# Patient Record
Sex: Female | Born: 1950 | Race: White | Hispanic: No | Marital: Married | Smoking: Never smoker
Health system: Southern US, Community
[De-identification: ages and names within clinical notes are randomized; demographics above are authoritative.]

## PROBLEM LIST (undated history)

## (undated) DIAGNOSIS — R112 Nausea with vomiting, unspecified: Secondary | ICD-10-CM

## (undated) DIAGNOSIS — E079 Disorder of thyroid, unspecified: Secondary | ICD-10-CM

## (undated) DIAGNOSIS — E0590 Thyrotoxicosis, unspecified without thyrotoxic crisis or storm: Secondary | ICD-10-CM

## (undated) DIAGNOSIS — Z9889 Other specified postprocedural states: Secondary | ICD-10-CM

---

## 1998-08-23 ENCOUNTER — Encounter (INDEPENDENT_AMBULATORY_CARE_PROVIDER_SITE_OTHER): Payer: Self-pay

## 1998-08-23 ENCOUNTER — Other Ambulatory Visit: Admission: RE | Admit: 1998-08-23 | Discharge: 1998-08-23 | Payer: Self-pay | Admitting: Obstetrics & Gynecology

## 1999-01-21 ENCOUNTER — Other Ambulatory Visit: Admission: RE | Admit: 1999-01-21 | Discharge: 1999-01-21 | Payer: Self-pay | Admitting: Obstetrics & Gynecology

## 1999-08-26 ENCOUNTER — Encounter: Payer: Self-pay | Admitting: Obstetrics & Gynecology

## 1999-08-26 ENCOUNTER — Encounter: Admission: RE | Admit: 1999-08-26 | Discharge: 1999-08-26 | Payer: Self-pay | Admitting: Obstetrics & Gynecology

## 2000-02-02 ENCOUNTER — Other Ambulatory Visit: Admission: RE | Admit: 2000-02-02 | Discharge: 2000-02-02 | Payer: Self-pay | Admitting: Obstetrics & Gynecology

## 2000-04-20 ENCOUNTER — Encounter: Admission: RE | Admit: 2000-04-20 | Discharge: 2000-04-20 | Payer: Self-pay | Admitting: Internal Medicine

## 2000-04-20 ENCOUNTER — Encounter: Payer: Self-pay | Admitting: Internal Medicine

## 2001-03-24 ENCOUNTER — Other Ambulatory Visit: Admission: RE | Admit: 2001-03-24 | Discharge: 2001-03-24 | Payer: Self-pay | Admitting: Obstetrics & Gynecology

## 2002-02-02 HISTORY — PX: ABDOMINAL HYSTERECTOMY: SHX81

## 2002-07-10 ENCOUNTER — Other Ambulatory Visit: Admission: RE | Admit: 2002-07-10 | Discharge: 2002-07-10 | Payer: Self-pay | Admitting: Obstetrics & Gynecology

## 2002-09-21 ENCOUNTER — Observation Stay (HOSPITAL_COMMUNITY): Admission: RE | Admit: 2002-09-21 | Discharge: 2002-09-22 | Payer: Self-pay | Admitting: Obstetrics & Gynecology

## 2002-09-21 ENCOUNTER — Encounter (INDEPENDENT_AMBULATORY_CARE_PROVIDER_SITE_OTHER): Payer: Self-pay | Admitting: Specialist

## 2004-07-25 ENCOUNTER — Ambulatory Visit (HOSPITAL_COMMUNITY): Admission: RE | Admit: 2004-07-25 | Discharge: 2004-07-25 | Payer: Self-pay | Admitting: *Deleted

## 2005-05-08 ENCOUNTER — Encounter: Admission: RE | Admit: 2005-05-08 | Discharge: 2005-05-08 | Payer: Self-pay | Admitting: Internal Medicine

## 2006-09-01 ENCOUNTER — Encounter: Admission: RE | Admit: 2006-09-01 | Discharge: 2006-09-01 | Payer: Self-pay | Admitting: Internal Medicine

## 2006-09-06 ENCOUNTER — Encounter: Admission: RE | Admit: 2006-09-06 | Discharge: 2006-09-06 | Payer: Self-pay | Admitting: Internal Medicine

## 2007-03-18 ENCOUNTER — Encounter: Admission: RE | Admit: 2007-03-18 | Discharge: 2007-03-18 | Payer: Self-pay | Admitting: Internal Medicine

## 2007-09-06 ENCOUNTER — Encounter: Admission: RE | Admit: 2007-09-06 | Discharge: 2007-09-06 | Payer: Self-pay | Admitting: Internal Medicine

## 2008-08-08 ENCOUNTER — Encounter: Admission: RE | Admit: 2008-08-08 | Discharge: 2008-08-08 | Payer: Self-pay | Admitting: Internal Medicine

## 2008-09-10 ENCOUNTER — Encounter: Admission: RE | Admit: 2008-09-10 | Discharge: 2008-09-10 | Payer: Self-pay | Admitting: Internal Medicine

## 2009-09-19 ENCOUNTER — Encounter: Admission: RE | Admit: 2009-09-19 | Discharge: 2009-09-19 | Payer: Self-pay | Admitting: Internal Medicine

## 2010-06-12 ENCOUNTER — Ambulatory Visit
Admission: RE | Admit: 2010-06-12 | Discharge: 2010-06-12 | Disposition: A | Discharge status: Home / Self Care | Payer: BC Managed Care – PPO | Source: Ambulatory Visit | Attending: Internal Medicine | Admitting: Internal Medicine

## 2010-06-12 ENCOUNTER — Other Ambulatory Visit: Payer: Self-pay | Admitting: Internal Medicine

## 2010-06-12 DIAGNOSIS — J189 Pneumonia, unspecified organism: Secondary | ICD-10-CM

## 2010-06-20 NOTE — H&P (Signed)
NAMEPENNEY, Andrea Fritz                         ACCOUNT NO.:  [id number]   MEDICAL RECORD NO.:  [id number]                   PATIENT TYPE:  AMB   LOCATION:  SDC                                  FACILITY:  [facility]   PHYSICIAN:  Freddy Finner, M.D.                DATE OF BIRTH:  [birth year]   DATE OF ADMISSION:  DATE OF DISCHARGE:                                HISTORY & PHYSICAL   NOTE:  Anticipated date of admission; September 21, 2002.   ADMITTING DIAGNOSES:  1. Uterine fibroids.  2. Probable uterine adenomyosis.  3. Endometrial polyp.  4. Dysfunctional uterine bleeding.  5. Request for definitive surgical intervention due to prolonged     dysfunctional uterine bleeding.   HISTORY OF PRESENT ILLNESS:  The patient is a 60 year old white married  female, gravida 4, para 2, with a several-year history of dysfunctional  uterine bleeding with attempts to manage this with oral contraceptives, more  conventional hormonal replacement therapy including Vivelle skin patches and  Prometrium.  She had her first episode of superficial phlebitis in January  2002.  She has had a subsequent recurrence on at least two or three  occasions.  She has been evaluated by Dr. Truett Perna, a hematologist, with no  apparent etiology for the problems, except for superficial varicosities.  It  has been encouraged for her not to continue with estrogen due to  predisposition to have recurrent phlebitis; and, because of the persistent  abnormal bleeding she has requested surgical intervention.  She is now  admitted for laparoscopically assisted vaginal hysterectomy and bilateral  salpingo-oophorectomy.   REVIEW OF SYSTEMS:  The patient's current review of systems is otherwise  negative, except for some vasomotor symptoms, which have improved on Prozac.   PAST MEDICAL HISTORY:  No other known significant medical illnesses.   MEDICATIONS:  The patient is not chronically on any medications.   ALLERGIES:  The patient  is allergic to DOXYCYCLINE and AMPICILLIN.   BLOOD TRANSFUSIONS:  The patient has never had a blood transfusion.   SOCIAL HISTORY:  The patient is not a cigarette smoker.   PAST SURGICAL HISTORY:  Previous surgical procedures include a laparoscopic  tubal sterilization in 1991.  She had a T&A as a young person.  She has had  two vaginal births.   FAMILY HISTORY:  Family history is noncontributory.   PHYSICAL EXAMINATION:  HEENT:  Grossly within normal limits.  VITAL SIGNS:  Blood pressure in the office is 128/90.  NECK:  Thyroid gland is not palpably enlarged.  BREAST EXAMINATION:  Considered to be normal.  No palpable masses, no skin  change or nipple discharge.  HEART:  Normal sinus rhythm without murmurs, rubs or gallops.  CHEST: Clear to auscultation.  ABDOMEN:  Soft and nontender without appreciable organomegaly or palpable  masses.  PELVIC FINDINGS:  External genitalia, vagina and cervix are normal.  Bimanual reveals the uterus  to be slightly enlarged and slightly tender to  palpation.  There are no palpable adnexal masses.  RECTAL:  The rectum is palpably normal and rectovaginal exam confirms the  above findings.  EXTREMITIES:  There are superficial varicosities.  No active evidence of  superficial phlebitis at this point in time.   ASSESSMENT:  1. Uterine leiomyomata.  2. Probable uterine adenomyosis.  3. Endometrial polyp.  4. Dysfunctional uterine bleeding.   PLAN:  Laparoscopically assisted vaginal hysterectomy and bilateral salpingo-  oophorectomy.                                                 Freddy Finner, M.D.    WRN/MEDQ  D:  09/20/2002  T:  09/20/2002  Job:  [id number]

## 2010-06-20 NOTE — Discharge Summary (Signed)
   Andrea Fritz, Andrea Fritz                         ACCOUNT NO.:  [id number]   MEDICAL RECORD NO.:  [id number]                   PATIENT TYPE:  OBV   LOCATION:  9323                                 FACILITY:  [facility]   PHYSICIAN:  Freddy Finner, M.D.                DATE OF BIRTH:  February 20, 1950   DATE OF ADMISSION:  09/21/2002  DATE OF DISCHARGE:  09/22/2002                                 DISCHARGE SUMMARY   DISCHARGE DIAGNOSES:  1. Uterine enlargement.  2. Extensive uterine adenomyosis.   OPERATION/PROCEDURE:  Laparoscopic assisted vaginal hysterectomy, bilateral  salpingo-oophorectomy.   POSTOPERATIVE COMPLICATIONS:  None.   DISPOSITION:  The patient is in satisfactory and improved condition at the  time of her discharge. She is to have progressively increasing physical  activity, but no vaginal entry. No heavy lifting. She is to resume her  regular Prozac dose 20 mg a day. She is given Darvocet to be taken as needed  for postoperative pain. She is to use ibuprofen also as needed. She is to  take a regular diet. She is to call for fever, heavy bleeding, or severe  pain.   HISTORY OF PRESENT ILLNESS:  For details of the present illness, past  history, family history, review of systems, and physical exam are according  to the admission note.   Briefly the admission findings were remarkable for enlargement of the uterus  and her history is remarkable for a prolonged history of dysfunctional  uterine bleeding.   LABORATORY DATA:  Admission hemoglobin 11.4, white count 9.1, postoperative  hemoglobin 10.1, white count 8.1. Admission urinalysis was normal. Admission  prothrombin time of 52, normal.   HOSPITAL COURSE:  The patient was admitted on the morning of surgery. She  was treated perioperatively with PIS and with Cefotetan. The above described  operative procedure was accomplished without significant intraoperative  difficulty and without postoperative complications. On the  afternoon of the  first postoperative day she was ambulating without difficulty and tolerating  a regular diet, and having adequate bowel and bladder function. Her  condition was considered to be satisfactory and she was discharged with the  disposition as noted above.                                               Freddy Finner, M.D.    WRN/MEDQ  D:  10/19/2002  T:  10/19/2002  Job:  [id number]

## 2010-06-20 NOTE — Op Note (Signed)
NAMEFATIMATA, Andrea Fritz                         ACCOUNT NO.:  [id number]   MEDICAL RECORD NO.:  [id number]                   PATIENT TYPE:  OBV   LOCATION:  9323                                 FACILITY:  [facility]   PHYSICIAN:  Freddy Finner, M.D.                DATE OF BIRTH:  May 09, 1950   DATE OF PROCEDURE:  09/21/2002  DATE OF DISCHARGE:                                 OPERATIVE REPORT   PREOPERATIVE DIAGNOSES:  1. Fibroid adenomyosis.  2. Dysfunctional uterine bleeding.   POSTOPERATIVE DIAGNOSES:  1. Fibroid adenomyosis.  2. Dysfunctional uterine bleeding.   PROCEDURE:  Laparoscopically vaginal hysterectomy,  bilateral salpingo-  oophorectomy.   SURGEON:  Freddy Finner, M.D.   ASSISTANT:  Stann Mainland. Vincente Poli, M.D..   ANESTHESIA:  General.   INTRAOPERATIVE COMPLICATIONS:   ESTIMATED BLOOD LOSS:  150 mL.   DESCRIPTION OF PROCEDURE:  The patient was admitted on the morning of  surgery and placed PASIs, given a bolus of Cefotan IV. She was brought to  the operating room and placed under adequate general anesthesia. She placed  in the dorsal lithotomy position using Allen stirrups. A Betadine prep was  carried out of the abdomen, perineum and vagina using scrub followed by  solution. The bladder was evacuated with a Robinson catheter. A Hulka  tenaculum was attached to the cervix. Sterile drapes were applied.   Two small  incisions were made, 1 at the  umbilicus, 1 just above the  symphysis. Through the upper  incision a 10-11 disposable bladed trocar was  introduced without difficulty while elevating the anterior abdominal wall  manually. Direct inspection revealed  adequate placement. Pneumoperitoneum  was allowed to accumulate. No injury was encouraged. A 5-mm trocar was  placed in the lower incision and through the blunt probe and later a  grasping forceps used.   Systematic examination of the pelvic and abdominal contents was carried out.  The appendix was normal. No  apparent abnormality was noted in the upper  abdomen. The pelvic findings were remarkable only for enlargement of the  uterus to approximately 10 to [redacted] weeks gestational size. The uterus weighed  309 gm after removal.   Using the spring-loaded grasping forceps the tube and ovary on each side was  elevated  sequentially using the gyrus bipolar dissecting device. The  infundibular pelvic ligaments were taken and the upper broad ligaments taken  to the level  just above the uterine arteries.   Attention was then turned vaginally. A posterior weighted retractor was  placed. The cervix was grasped with a Gerilyn Pilgrim tenaculum. Deavers were used  anteriorly and laterally for exposure. A posterior  colpotomy incision was  made while tending the mucosa posterior  to the cervix. The cervix was  released with a scalpel, incising the mucosa throughout the remaining  circumference of the cervix.   Using the LigaSure system, the uterosacral pedicles, bladder pillars  and  cardinal ligament pedicles were sealed and divided. This was done after  advancing the bladder off the cervix. The bladder was further advanced at  this point and the anterior peritoneum entered. The uterine artery pedicles  on each side were retracted with the LigaSure system, sealed and divided and  an additional pedicle just above the vessels was taken, sealed and divided.  The uterus was large and was cored to allow delivery of the uterus through  the vaginal introitus.   After removal of the uterus the uterosacrals were anchored to the vaginal  mucosa with a mattress suture of 0 Monocryl. The uterosacrals were plicated  posteriorly and then closed with an interrupted Vicryl suture. The cuff was  closed vertically with figure-of-8 suture of Monocryl. A Foley catheter was  placed.   Reinspection laparoscopically was carried out. Hemostasis was noted to be  complete throughout. The procedure at this point was terminated. Gas was   allowed to escape from the abdomen. The skin incisions were closed with  interrupted subcuticular sutures of 3-0 Dexon. Then 0.5% plain Marcaine was  injected in the incision sites for postoperative analgesia.   The patient tolerated the procedure well. She was awakened and taken to  recovery in good condition.                                               Freddy Finner, M.D.    WRN/MEDQ  D:  09/21/2002  T:  09/21/2002  Job:  [id number]

## 2010-09-03 ENCOUNTER — Other Ambulatory Visit: Payer: Self-pay | Admitting: Internal Medicine

## 2010-09-03 DIAGNOSIS — Z1231 Encounter for screening mammogram for malignant neoplasm of breast: Secondary | ICD-10-CM

## 2010-09-23 ENCOUNTER — Ambulatory Visit
Admission: RE | Admit: 2010-09-23 | Discharge: 2010-09-23 | Disposition: A | Discharge status: Home / Self Care | Payer: BC Managed Care – PPO | Source: Ambulatory Visit | Attending: Internal Medicine | Admitting: Internal Medicine

## 2010-09-23 DIAGNOSIS — Z1231 Encounter for screening mammogram for malignant neoplasm of breast: Secondary | ICD-10-CM

## 2011-08-31 ENCOUNTER — Other Ambulatory Visit: Payer: Self-pay | Admitting: Internal Medicine

## 2011-08-31 DIAGNOSIS — Z1231 Encounter for screening mammogram for malignant neoplasm of breast: Secondary | ICD-10-CM

## 2011-09-24 ENCOUNTER — Ambulatory Visit
Admission: RE | Admit: 2011-09-24 | Discharge: 2011-09-24 | Disposition: A | Discharge status: Home / Self Care | Payer: BC Managed Care – PPO | Source: Ambulatory Visit | Attending: Internal Medicine | Admitting: Internal Medicine

## 2011-09-24 DIAGNOSIS — Z1231 Encounter for screening mammogram for malignant neoplasm of breast: Secondary | ICD-10-CM

## 2012-09-06 ENCOUNTER — Other Ambulatory Visit: Payer: Self-pay

## 2012-09-06 DIAGNOSIS — Z1231 Encounter for screening mammogram for malignant neoplasm of breast: Secondary | ICD-10-CM

## 2012-10-04 ENCOUNTER — Ambulatory Visit
Admission: RE | Admit: 2012-10-04 | Discharge: 2012-10-04 | Disposition: A | Discharge status: Home / Self Care | Payer: BC Managed Care – PPO | Source: Ambulatory Visit

## 2012-10-04 DIAGNOSIS — Z1231 Encounter for screening mammogram for malignant neoplasm of breast: Secondary | ICD-10-CM

## 2013-09-05 ENCOUNTER — Other Ambulatory Visit: Payer: Self-pay

## 2013-09-05 DIAGNOSIS — Z1231 Encounter for screening mammogram for malignant neoplasm of breast: Secondary | ICD-10-CM

## 2013-10-06 ENCOUNTER — Ambulatory Visit
Admission: RE | Admit: 2013-10-06 | Discharge: 2013-10-06 | Disposition: A | Discharge status: Home / Self Care | Payer: BC Managed Care – PPO | Source: Ambulatory Visit

## 2013-10-06 DIAGNOSIS — Z1231 Encounter for screening mammogram for malignant neoplasm of breast: Secondary | ICD-10-CM

## 2014-09-03 ENCOUNTER — Other Ambulatory Visit: Payer: Self-pay

## 2014-09-03 DIAGNOSIS — Z1231 Encounter for screening mammogram for malignant neoplasm of breast: Secondary | ICD-10-CM

## 2014-10-09 ENCOUNTER — Ambulatory Visit
Admission: RE | Admit: 2014-10-09 | Discharge: 2014-10-09 | Disposition: A | Discharge status: Home / Self Care | Payer: BC Managed Care – PPO | Source: Ambulatory Visit

## 2014-10-09 DIAGNOSIS — Z1231 Encounter for screening mammogram for malignant neoplasm of breast: Secondary | ICD-10-CM

## 2014-11-27 ENCOUNTER — Encounter (HOSPITAL_COMMUNITY): Payer: Self-pay

## 2014-11-27 ENCOUNTER — Emergency Department (HOSPITAL_COMMUNITY)
Admission: EM | Admit: 2014-11-27 | Discharge: 2014-11-27 | Disposition: A | Discharge status: Home / Self Care | Payer: Worker's Compensation | Attending: Emergency Medicine | Admitting: Emergency Medicine

## 2014-11-27 ENCOUNTER — Emergency Department (HOSPITAL_COMMUNITY): Payer: Worker's Compensation

## 2014-11-27 ENCOUNTER — Other Ambulatory Visit (HOSPITAL_COMMUNITY): Payer: Self-pay | Admitting: Orthopaedic Surgery

## 2014-11-27 DIAGNOSIS — S52002A Unspecified fracture of upper end of left ulna, initial encounter for closed fracture: Secondary | ICD-10-CM

## 2014-11-27 DIAGNOSIS — S52182A Other fracture of upper end of left radius, initial encounter for closed fracture: Secondary | ICD-10-CM | POA: Insufficient documentation

## 2014-11-27 DIAGNOSIS — S62112A Displaced fracture of triquetrum [cuneiform] bone, left wrist, initial encounter for closed fracture: Secondary | ICD-10-CM

## 2014-11-27 DIAGNOSIS — Y998 Other external cause status: Secondary | ICD-10-CM | POA: Diagnosis not present

## 2014-11-27 DIAGNOSIS — S52102A Unspecified fracture of upper end of left radius, initial encounter for closed fracture: Secondary | ICD-10-CM

## 2014-11-27 DIAGNOSIS — S52092A Other fracture of upper end of left ulna, initial encounter for closed fracture: Secondary | ICD-10-CM | POA: Diagnosis not present

## 2014-11-27 DIAGNOSIS — Y9289 Other specified places as the place of occurrence of the external cause: Secondary | ICD-10-CM | POA: Insufficient documentation

## 2014-11-27 DIAGNOSIS — S6992XA Unspecified injury of left wrist, hand and finger(s), initial encounter: Secondary | ICD-10-CM | POA: Diagnosis present

## 2014-11-27 DIAGNOSIS — S52592A Other fractures of lower end of left radius, initial encounter for closed fracture: Secondary | ICD-10-CM | POA: Insufficient documentation

## 2014-11-27 DIAGNOSIS — Y9389 Activity, other specified: Secondary | ICD-10-CM | POA: Diagnosis not present

## 2014-11-27 DIAGNOSIS — Z8639 Personal history of other endocrine, nutritional and metabolic disease: Secondary | ICD-10-CM | POA: Diagnosis not present

## 2014-11-27 DIAGNOSIS — S52502A Unspecified fracture of the lower end of left radius, initial encounter for closed fracture: Secondary | ICD-10-CM

## 2014-11-27 DIAGNOSIS — W108XXA Fall (on) (from) other stairs and steps, initial encounter: Secondary | ICD-10-CM | POA: Diagnosis not present

## 2014-11-27 HISTORY — DX: Disorder of thyroid, unspecified: E07.9

## 2014-11-27 MED ORDER — HYDROCODONE-ACETAMINOPHEN 5-325 MG PO TABS: 1.0000 | ORAL_TABLET | Freq: Four times a day (QID) | ORAL | Status: AC | PRN

## 2014-11-27 MED ORDER — LIDOCAINE-EPINEPHRINE 1 %-1:100000 IJ SOLN
10.0000 mL | Freq: Once | INTRAMUSCULAR | Status: AC
  Administered 2014-11-27: 10 mL
  Filled 2014-11-27: qty 1

## 2014-11-27 MED ORDER — ONDANSETRON HCL 4 MG/2ML IJ SOLN
4.0000 mg | Freq: Once | INTRAMUSCULAR | Status: AC
  Administered 2014-11-27: 4 mg via INTRAVENOUS
  Filled 2014-11-27: qty 2

## 2014-11-27 MED ORDER — HYDROCODONE-ACETAMINOPHEN 5-325 MG PO TABS
1.0000 | ORAL_TABLET | Freq: Once | ORAL | Status: AC
  Administered 2014-11-27: 1 via ORAL
  Filled 2014-11-27: qty 1

## 2014-11-27 MED ORDER — HYDROMORPHONE HCL 1 MG/ML IJ SOLN
0.5000 mg | Freq: Once | INTRAMUSCULAR | Status: AC
  Administered 2014-11-27: 0.5 mg via INTRAVENOUS
  Filled 2014-11-27: qty 1

## 2014-11-27 MED ORDER — HYDROMORPHONE HCL 1 MG/ML IJ SOLN
1.0000 mg | Freq: Once | INTRAMUSCULAR | Status: AC
Start: 2014-11-27 — End: 2014-11-27
  Administered 2014-11-27: 1 mg via INTRAVENOUS
  Filled 2014-11-27: qty 1

## 2014-11-27 NOTE — ED Notes (Signed)
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 Pt missed a step and fell of the stage. Patient reports pain on the left wrist and elbow. Reports hitting head with no LOC. Pt is ambulatory. Reports good circulation and feeling to the left arm.

## 2014-11-27 NOTE — ED Notes (Signed)
Patient returned from X-ray 

## 2014-11-27 NOTE — Discharge Instructions (Signed)
Forearm Fracture A forearm fracture is a break in one or both of the bones of your arm that are between the elbow and the wrist. Your forearm is made up of two bones:  Radius. This is the bone on the inside of your arm near your thumb.  Ulna. This is the bone on the outside of your arm near your little finger. Middle forearm fractures usually break both the radius and the ulna. Most forearm fractures that involve both the ulna and radius will require surgery. CAUSES Common causes of this type of fracture include:  Falling on an outstretched arm.  Accidents, such as a car or bike accident.  A hard, direct hit to the middle part of your arm. RISK FACTORS You may be at higher risk for this type of fracture if:  You play contact sports.  You have a condition that causes your bones to be weak or thin (osteoporosis). SIGNS AND SYMPTOMS A forearm fracture causes pain immediately after the injury. Other signs and symptoms include:  An abnormal bend or bump in your arm (deformity).  Swelling.  Numbness or tingling.  Tenderness.  Inability to turn your hand from side to side (rotate).  Bruising. DIAGNOSIS Your health care provider may diagnose a forearm fracture based on:  Your symptoms.  Your medical history, including any recent injury.  A physical exam. Your health care provider will look for any deformity and feel for tenderness over the break. Your health care provider will also check whether the bones are out of place.  An X-ray exam to confirm the diagnosis and learn more about the type of fracture. TREATMENT The goals of treatment are to get the bone or bones in proper position for healing and to keep the bones from moving so they will heal over time. Your treatment will depend on many factors, especially the type of fracture that you have.  If the fractured bone or bones:  Are in the correct position (nondisplaced), you may only need to wear a cast or a  splint.  Have a slightly displaced fracture, you may need to have the bones moved back into place manually (closed reduction) before the splint or cast is put on.  You may have a temporary splint before you have a cast. The splint allows room for some swelling. After a few days, a cast can replace the splint.  You may have to wear the cast for 6-8 weeks or as directed by your health care provider.  The cast may be changed after about 3 weeks or as directed by your health care provider.  After your cast is removed, you may need physical therapy to regain full movement in your wrist or elbow.  You may need emergency surgery if you have:  A fractured bone or bones that are out of position (displaced).  A fracture with multiple fragments (comminuted fracture).  A fracture that breaks the skin (open fracture). This type of fracture may require surgical wires, plates, or screws to hold the bone or bones in place.  You may have X-rays every couple of weeks to check on your healing. HOME CARE INSTRUCTIONS If You Have a Cast:  Do not stick anything inside the cast to scratch your skin. Doing that increases your risk of infection.  Check the skin around the cast every day. Report any concerns to your health care provider. You may put lotion on dry skin around the edges of the cast. Do not apply lotion to the skin  underneath the cast. If You Have a Splint:  Wear it as directed by your health care provider. Remove it only as directed by your health care provider.  Loosen the splint if your fingers become numb and tingle, or if they turn cold and blue. Bathing  Cover the cast or splint with a watertight plastic bag to protect it from water while you bathe or shower. Do not let the cast or splint get wet. Managing Pain, Stiffness, and Swelling  If directed, apply ice to the injured area:  Put ice in a plastic bag.  Place a towel between your skin and the bag.  Leave the ice on for 20  minutes, 2-3 times a day.  Move your fingers often to avoid stiffness and to lessen swelling.  Raise the injured area above the level of your heart while you are sitting or lying down. Driving  Do not drive or operate heavy machinery while taking pain medicine.  Do not drive while wearing a cast or splint on a hand that you use for driving. Activity  Return to your normal activities as directed by your health care provider. Ask your health care provider what activities are safe for you.  Perform range-of-motion exercises only as directed by your health care provider. Safety  Do not use your injured limb to support your body weight until your health care provider says that you can. General Instructions  Do not put pressure on any part of the cast or splint until it is fully hardened. This may take several hours.  Keep the cast or splint clean and dry.  Do not use any tobacco products, including cigarettes, chewing tobacco, or electronic cigarettes. Tobacco can delay bone healing. If you need help quitting, ask your health care provider.  Take medicines only as directed by your health care provider.  Keep all follow-up visits as directed by your health care provider. This is important. SEEK MEDICAL CARE IF:  Your pain medicine is not helping.  Your cast or splint becomes wet or damaged or suddenly feels too tight.  Your cast becomes loose.  You have more severe pain or swelling than you did before the cast.  You have severe pain when you stretch your fingers.  You continue to have pain or stiffness in your elbow or your wrist after your cast is removed. SEEK IMMEDIATE MEDICAL CARE IF:  You cannot move your fingers.  You lose feeling in your fingers or your hand.  Your hand or your fingers turn cold and pale or blue.  You notice a bad smell coming from your cast.  You have drainage from underneath your cast.  You have new stains from blood or drainage that is coming  through your cast.   This information is not intended to replace advice given to you by your health care provider. Make sure you discuss any questions you have with your health care provider.   Document Released: 01/17/2000 Document Revised: 02/09/2014 Document Reviewed: 09/04/2013 Elsevier Interactive Patient Education [employer].

## 2014-11-27 NOTE — Progress Notes (Signed)
Orthopedic Tech Progress Note Patient Details:  Andrea Fritz 10-21-1950 [id number]  Ortho Devices Type of Ortho Device: Ace wrap, Post (long arm) splint, Sugartong splint Ortho Device/Splint Location: LUE Ortho Device/Splint Interventions: Ordered, Application   Braulio Bosch 11/27/2014, 3:44 PM

## 2014-11-27 NOTE — ED Notes (Signed)
Spoke with Ortho. Pt is on the list to be done. Pt made aware. Waiting for Ortho.

## 2014-11-27 NOTE — ED Notes (Signed)
Spoke with Hand Surgeon about patient's care. Patient comfortable.

## 2014-11-27 NOTE — ED Provider Notes (Addendum)
CSN: [id number]     Arrival date & time 11/27/14  1052 History   First MD Initiated Contact with Patient 11/27/14 1100     Chief Complaint  Patient presents with  . Arm Injury     (Consider location/radiation/quality/duration/timing/severity/associated sxs/prior Treatment) Patient is a 64 y.o. female presenting with arm injury. The history is provided by the patient.  Arm Injury Location:  Wrist and elbow Time since incident:  1 hour Injury: yes   Mechanism of injury: fall   Mechanism of injury comment:  Pt was walking up the risers with her class at a church and she stepped backwards but there was no step.  she fell down on the left outstretched arm Fall:    Fall occurred:  Down stairs   Impact surface:  Chief Technology Officer of impact:  Outstretched arms   Entrapped after fall: no   Elbow location:  L elbow Wrist location:  L wrist Pain details:    Quality:  Shooting, throbbing and sharp   Radiates to:  Does not radiate   Severity:  Severe   Onset quality:  Sudden   Timing:  Constant   Progression:  Unchanged Chronicity:  New Handedness:  Right-handed Dislocation: yes   Foreign body present:  No foreign bodies Prior injury to area:  No Relieved by:  Immobilization Worsened by:  Movement Ineffective treatments:  None tried Associated symptoms: decreased range of motion, stiffness and swelling   Associated symptoms: no muscle weakness, no numbness and no tingling   Associated symptoms comment:  No loss of consciousness or significant head trauma. She thinks she may of bumped her head on the floor but has no complaints of headache   Past Medical History  Diagnosis Date  . Thyroid disease     Hypothyroidism   Past Surgical History  Procedure Laterality Date  . Abdominal hysterectomy     No family history on file. Social History  Substance Use Topics  . Smoking status: Never Smoker   . Smokeless tobacco: Never Used  . Alcohol Use: No   OB History    No  data available     Review of Systems  Musculoskeletal: Positive for stiffness.  All other systems reviewed and are negative.     Allergies  Red clover and Tetracyclines & related  Home Medications   Prior to Admission medications   Not on File   BP 145/82 mmHg  Pulse 85  Resp 16  SpO2 99% Physical Exam  Constitutional: She is oriented to person, place, and time. She appears well-developed and well-nourished. No distress.  HENT:  Head: Normocephalic and atraumatic.  Mouth/Throat: Oropharynx is clear and moist.  Eyes: Conjunctivae and EOM are normal. Pupils are equal, round, and reactive to light.  Neck: Normal range of motion. Neck supple.  Cardiovascular: Normal rate, regular rhythm and intact distal pulses.   No murmur heard. Pulmonary/Chest: Effort normal and breath sounds normal. No respiratory distress. She has no wheezes. She has no rales.  Abdominal: Soft. She exhibits no distension. There is no tenderness. There is no rebound and no guarding.  Musculoskeletal: She exhibits no edema.       Left shoulder: Normal.       Left elbow: She exhibits decreased range of motion, swelling and deformity. Tenderness found.       Left wrist: She exhibits decreased range of motion, tenderness, bony tenderness, swelling and deformity.       Cervical back: Normal.  Normal sensation and  grip of the left hand. 2+ radial pulse. Unable to range the wrist or the elbow due to injury and deformity. Normal shoulder exam. No pain with palpation over the legs and full range of motion of the hips. Patient is able to ambulate with only minimal pain  Neurological: She is alert and oriented to person, place, and time.  Skin: Skin is warm and dry. No rash noted. No erythema.  Psychiatric: She has a normal mood and affect. Her behavior is normal.  Nursing note and vitals reviewed.   ED Course  Procedures (including critical care time) Labs Review Labs Reviewed - No data to display  Imaging  Review Dg Elbow Complete Left  11/27/2014  CLINICAL DATA:  Pain and deformity in the left elbow and wrist after a fall, initial encounter. EXAM: LEFT ELBOW - COMPLETE 3+ VIEW COMPARISON:  None. FINDINGS: A comminuted fracture of the proximal ulna is seen with approximately 10 mm of distraction of the fracture fragments. Nondisplaced radial head fracture is seen as well. Marked overlying soft tissue swelling. IMPRESSION: Proximal ulnar and radial head fractures. Electronically Signed   By: Lorin Picket M.D.   On: 11/27/2014 12:45   Dg Wrist Complete Left  11/27/2014  CLINICAL DATA:  Post closed reduction for a left wrist fracture. EXAM: LEFT WRIST - COMPLETE 3+ VIEW COMPARISON:  11/27/2014 at 12:22 p.m. FINDINGS: Following closed reduction, there has been improved alignment of the distal radial fracture. Radial length has been returned to near anatomic. The dorsal displacement of distal radial fracture component has been partly reduced with approximately 3.5 mm of displacement persisting. The dorsal angulation of the distal radial articular surface has been improved, with approximately 21 degrees of residual dorsal articular surface angulation. IMPRESSION: Partial reduction of the distal radial fracture as described. Electronically Signed   By: Lajean Manes M.D.   On: 11/27/2014 16:01   Dg Wrist Complete Left  11/27/2014  CLINICAL DATA:  Fall with pain in deformity of the left wrist. Initial encounter. EXAM: LEFT WRIST - COMPLETE 3+ VIEW COMPARISON:  None. FINDINGS: Comminuted fracture of the distal radius with articular extension along the dorsal and medial aspect of the radius. The fracture is posteriorly displaced and impacted with dorsal tilting. There is associated loss of radial inclination. Located radiocarpal joint. Avulsion fracture from the dorsal triquetrum. IMPRESSION: 1. Comminuted and displaced distal radius fracture. 2. Dorsal triquetrum fracture. Electronically Signed   By: Monte Fantasia M.D.   On: 11/27/2014 12:47   I have personally reviewed and evaluated these images and lab results as part of my medical decision-making.   EKG Interpretation None      MDM   Final diagnoses:  Fracture of triquetrum of left wrist, closed, initial encounter  Distal radius fracture, left, closed, initial encounter  Radius and ulna proximal end fracture, left, closed, initial encounter   Patient with a mechanical fall today off bleachers onto the floor on an outstretched arm. She did hit her head on the floor but it was minor and did not cause loss of consciousness. She denies a headache and has no history of taking blood thinners other than aspirin. She denies any neck or back pain. No chest tenderness or abdominal pain. She was able to ambulate without difficulty with low suspicion for any lower extremity injury. Concern for a left elbow fracture/dislocation as well as a both bone distal forearm fracture with deformity in the left wrist. Fractures are closed. Sensation and pulse are intact.  Imaging  pending and patient given IV pain control  2:50 PM Evidence of comminuted distal radius fracture, proximal ulna fracture and triquetrum fracture.  Discussed with Dr.Xu who wanted me to attempt bedside reduction. Hematoma block performed with good anesthesia and manual reduction. We'll splint and reimage.  4:07 PM Partial reduction.  Dr. Erlinda Hong office will f/u with the pt.   Blanchie Dessert, MD 11/27/14 1611  Blanchie Dessert, MD 11/27/14 [phone]

## 2014-11-27 NOTE — ED Notes (Signed)
Finger trap at bedside

## 2014-11-28 ENCOUNTER — Ambulatory Visit (HOSPITAL_COMMUNITY): Payer: Worker's Compensation

## 2014-11-28 ENCOUNTER — Ambulatory Visit (HOSPITAL_COMMUNITY): Payer: Worker's Compensation | Admitting: Certified Registered Nurse Anesthetist

## 2014-11-28 ENCOUNTER — Encounter (HOSPITAL_COMMUNITY)
Admission: RE | Disposition: A | Discharge status: Home / Self Care | Payer: Self-pay | Source: Ambulatory Visit | Attending: Orthopaedic Surgery

## 2014-11-28 ENCOUNTER — Encounter (HOSPITAL_COMMUNITY): Payer: Self-pay

## 2014-11-28 ENCOUNTER — Ambulatory Visit (HOSPITAL_COMMUNITY)
Admission: RE | Admit: 2014-11-28 | Discharge: 2014-11-28 | Disposition: A | Discharge status: Home / Self Care | Payer: Worker's Compensation | Source: Ambulatory Visit | Attending: Orthopaedic Surgery | Admitting: Orthopaedic Surgery

## 2014-11-28 DIAGNOSIS — S52022A Displaced fracture of olecranon process without intraarticular extension of left ulna, initial encounter for closed fracture: Secondary | ICD-10-CM | POA: Insufficient documentation

## 2014-11-28 DIAGNOSIS — S52122A Displaced fracture of head of left radius, initial encounter for closed fracture: Secondary | ICD-10-CM | POA: Insufficient documentation

## 2014-11-28 DIAGNOSIS — Z7982 Long term (current) use of aspirin: Secondary | ICD-10-CM | POA: Insufficient documentation

## 2014-11-28 DIAGNOSIS — Z79899 Other long term (current) drug therapy: Secondary | ICD-10-CM | POA: Insufficient documentation

## 2014-11-28 DIAGNOSIS — S62112A Displaced fracture of triquetrum [cuneiform] bone, left wrist, initial encounter for closed fracture: Secondary | ICD-10-CM | POA: Diagnosis not present

## 2014-11-28 DIAGNOSIS — W19XXXA Unspecified fall, initial encounter: Secondary | ICD-10-CM | POA: Insufficient documentation

## 2014-11-28 DIAGNOSIS — S52502A Unspecified fracture of the lower end of left radius, initial encounter for closed fracture: Secondary | ICD-10-CM | POA: Diagnosis not present

## 2014-11-28 DIAGNOSIS — E039 Hypothyroidism, unspecified: Secondary | ICD-10-CM | POA: Diagnosis not present

## 2014-11-28 DIAGNOSIS — Z419 Encounter for procedure for purposes other than remedying health state, unspecified: Secondary | ICD-10-CM

## 2014-11-28 HISTORY — DX: Thyrotoxicosis, unspecified without thyrotoxic crisis or storm: E05.90

## 2014-11-28 HISTORY — PX: ORIF ELBOW FRACTURE: SHX5031

## 2014-11-28 HISTORY — DX: Nausea with vomiting, unspecified: R11.2

## 2014-11-28 HISTORY — DX: Other specified postprocedural states: Z98.890

## 2014-11-28 HISTORY — PX: OPEN REDUCTION INTERNAL FIXATION (ORIF) DISTAL RADIAL FRACTURE: SHX5989

## 2014-11-28 LAB — CBC
HCT: 40.5 % (ref 36.0–46.0)
HEMOGLOBIN: 13.6 g/dL (ref 12.0–15.0)
MCH: 31.2 pg (ref 26.0–34.0)
MCHC: 33.6 g/dL (ref 30.0–36.0)
MCV: 92.9 fL (ref 78.0–100.0)
PLATELETS: 225 10*3/uL (ref 150–400)
RBC: 4.36 MIL/uL (ref 3.87–5.11)
RDW: 13.6 % (ref 11.5–15.5)
WBC: 10.9 10*3/uL — AB (ref 4.0–10.5)

## 2014-11-28 LAB — BASIC METABOLIC PANEL
ANION GAP: 15 (ref 5–15)
BUN: 10 mg/dL (ref 6–20)
CHLORIDE: 104 mmol/L (ref 101–111)
CO2: 20 mmol/L — ABNORMAL LOW (ref 22–32)
Calcium: 9.4 mg/dL (ref 8.9–10.3)
Creatinine, Ser: 0.65 mg/dL (ref 0.44–1.00)
GFR calc Af Amer: >60 mL/min (ref >60)
GLUCOSE: 124 mg/dL — AB (ref 65–99)
POTASSIUM: 3.9 mmol/L (ref 3.5–5.1)
SODIUM: 139 mmol/L (ref 135–145)

## 2014-11-28 SURGERY — OPEN REDUCTION INTERNAL FIXATION (ORIF) DISTAL RADIUS FRACTURE
Anesthesia: Regional | Site: Wrist | Laterality: Left

## 2014-11-28 MED ORDER — METHOCARBAMOL 750 MG PO TABS: 750.0000 mg | ORAL_TABLET | Freq: Two times a day (BID) | ORAL | Status: AC | PRN

## 2014-11-28 MED ORDER — PHENYLEPHRINE HCL 10 MG/ML IJ SOLN
INTRAMUSCULAR | Status: DC | PRN
  Administered 2014-11-28: 80 ug via INTRAVENOUS
  Administered 2014-11-28: 120 ug via INTRAVENOUS
  Administered 2014-11-28: 80 ug via INTRAVENOUS

## 2014-11-28 MED ORDER — ACETAMINOPHEN 325 MG PO TABS: 325.0000 mg | ORAL_TABLET | ORAL | Status: DC | PRN

## 2014-11-28 MED ORDER — EPHEDRINE SULFATE 50 MG/ML IJ SOLN
INTRAMUSCULAR | Status: AC
  Filled 2014-11-28: qty 1

## 2014-11-28 MED ORDER — OXYCODONE HCL 5 MG/5ML PO SOLN: 5.0000 mg | Freq: Once | ORAL | Status: DC | PRN

## 2014-11-28 MED ORDER — CEFAZOLIN SODIUM-DEXTROSE 2-3 GM-% IV SOLR
2.0000 g | INTRAVENOUS | Status: AC
  Administered 2014-11-28: 2 g via INTRAVENOUS

## 2014-11-28 MED ORDER — CEFAZOLIN SODIUM-DEXTROSE 2-3 GM-% IV SOLR
INTRAVENOUS | Status: AC
  Filled 2014-11-28: qty 50

## 2014-11-28 MED ORDER — FENTANYL CITRATE (PF) 250 MCG/5ML IJ SOLN
INTRAMUSCULAR | Status: AC
  Filled 2014-11-28: qty 5

## 2014-11-28 MED ORDER — EPHEDRINE SULFATE 50 MG/ML IJ SOLN
INTRAMUSCULAR | Status: DC | PRN
Start: 2014-11-28 — End: 2014-11-28
  Administered 2014-11-28 (×3): 10 mg via INTRAVENOUS

## 2014-11-28 MED ORDER — PROPOFOL 10 MG/ML IV BOLUS
INTRAVENOUS | Status: DC | PRN
  Administered 2014-11-28: 160 mg via INTRAVENOUS

## 2014-11-28 MED ORDER — ROCURONIUM BROMIDE 50 MG/5ML IV SOLN
INTRAVENOUS | Status: AC
  Filled 2014-11-28: qty 1

## 2014-11-28 MED ORDER — HYDROMORPHONE HCL 1 MG/ML IJ SOLN: 0.2500 mg | INTRAMUSCULAR | Status: DC | PRN

## 2014-11-28 MED ORDER — LACTATED RINGERS IV SOLN
INTRAVENOUS | Status: DC | PRN
  Administered 2014-11-28 (×2): via INTRAVENOUS

## 2014-11-28 MED ORDER — FENTANYL CITRATE (PF) 100 MCG/2ML IJ SOLN
100.0000 ug | Freq: Once | INTRAMUSCULAR | Status: AC
  Administered 2014-11-28: 100 ug via INTRAVENOUS

## 2014-11-28 MED ORDER — LACTATED RINGERS IV SOLN: INTRAVENOUS | Status: DC

## 2014-11-28 MED ORDER — SODIUM CHLORIDE 0.9 % IV SOLN
10.0000 mg | INTRAVENOUS | Status: DC | PRN
  Administered 2014-11-28: 50 ug/min via INTRAVENOUS

## 2014-11-28 MED ORDER — OXYCODONE HCL 5 MG PO TABS: 5.0000 mg | ORAL_TABLET | Freq: Once | ORAL | Status: DC | PRN

## 2014-11-28 MED ORDER — ACETAMINOPHEN 160 MG/5ML PO SOLN: 325.0000 mg | ORAL | Status: DC | PRN

## 2014-11-28 MED ORDER — LIDOCAINE HCL (CARDIAC) 20 MG/ML IV SOLN
INTRAVENOUS | Status: AC
  Filled 2014-11-28: qty 5

## 2014-11-28 MED ORDER — PROPOFOL 10 MG/ML IV BOLUS
INTRAVENOUS | Status: AC
  Filled 2014-11-28: qty 20

## 2014-11-28 MED ORDER — OXYCODONE-ACETAMINOPHEN 5-325 MG PO TABS: 1.0000 | ORAL_TABLET | ORAL | Status: AC | PRN

## 2014-11-28 MED ORDER — LACTATED RINGERS IV SOLN
INTRAVENOUS | Status: DC
  Administered 2014-11-28: 12:00:00 via INTRAVENOUS

## 2014-11-28 MED ORDER — LIDOCAINE HCL (CARDIAC) 20 MG/ML IV SOLN
INTRAVENOUS | Status: DC | PRN
  Administered 2014-11-28: 80 mg via INTRAVENOUS

## 2014-11-28 MED ORDER — BUPIVACAINE-EPINEPHRINE (PF) 0.5% -1:200000 IJ SOLN
INTRAMUSCULAR | Status: DC | PRN
  Administered 2014-11-28: 30 mL via PERINEURAL

## 2014-11-28 MED ORDER — ONDANSETRON HCL 4 MG/2ML IJ SOLN
INTRAMUSCULAR | Status: AC
  Filled 2014-11-28: qty 2

## 2014-11-28 MED ORDER — PHENYLEPHRINE 40 MCG/ML (10ML) SYRINGE FOR IV PUSH (FOR BLOOD PRESSURE SUPPORT)
PREFILLED_SYRINGE | INTRAVENOUS | Status: AC
  Filled 2014-11-28: qty 10

## 2014-11-28 MED ORDER — MIDAZOLAM HCL 2 MG/2ML IJ SOLN
2.0000 mg | Freq: Once | INTRAMUSCULAR | Status: AC
  Administered 2014-11-28: 2 mg via INTRAVENOUS

## 2014-11-28 MED ORDER — FENTANYL CITRATE (PF) 100 MCG/2ML IJ SOLN
INTRAMUSCULAR | Status: DC | PRN
  Administered 2014-11-28 (×2): 25 ug via INTRAVENOUS

## 2014-11-28 MED ORDER — MIDAZOLAM HCL 2 MG/2ML IJ SOLN
INTRAMUSCULAR | Status: AC
  Filled 2014-11-28: qty 4

## 2014-11-28 MED ORDER — SODIUM CHLORIDE 0.9 % IJ SOLN
INTRAMUSCULAR | Status: AC
  Filled 2014-11-28: qty 10

## 2014-11-28 MED ORDER — SODIUM CHLORIDE 0.9 % IR SOLN
Status: DC | PRN
  Administered 2014-11-28: 3000 mL

## 2014-11-28 MED ORDER — 0.9 % SODIUM CHLORIDE (POUR BTL) OPTIME
TOPICAL | Status: DC | PRN
  Administered 2014-11-28: 1000 mL

## 2014-11-28 MED ORDER — OXYCODONE HCL ER 10 MG PO T12A: 10.0000 mg | EXTENDED_RELEASE_TABLET | Freq: Two times a day (BID) | ORAL | Status: AC

## 2014-11-28 MED ORDER — ONDANSETRON HCL 4 MG/2ML IJ SOLN
INTRAMUSCULAR | Status: DC | PRN
  Administered 2014-11-28: 4 mg via INTRAVENOUS

## 2014-11-28 SURGICAL SUPPLY — 82 items
BANDAGE ELASTIC 3 VELCRO ST LF (GAUZE/BANDAGES/DRESSINGS) ×3 IMPLANT
BANDAGE ELASTIC 4 VELCRO ST LF (GAUZE/BANDAGES/DRESSINGS) ×3 IMPLANT
BIT DRILL 1.9X15 (BIT) ×2
BIT DRILL 1.9X15MM (BIT) ×2 IMPLANT
BIT DRILL 2.6 (BIT) ×3 IMPLANT
BLADE SURG 10 STRL SS (BLADE) ×3 IMPLANT
BLADE SURG 15 STRL LF DISP TIS (BLADE) ×2 IMPLANT
BLADE SURG 15 STRL SS (BLADE) ×1
BLADE SURG ROTATE 9660 (MISCELLANEOUS) IMPLANT
BNDG ESMARK 4X9 LF (GAUZE/BANDAGES/DRESSINGS) ×3 IMPLANT
BNDG GAUZE ELAST 4 BULKY (GAUZE/BANDAGES/DRESSINGS) IMPLANT
CORDS BIPOLAR (ELECTRODE) ×3 IMPLANT
COVER SURGICAL LIGHT HANDLE (MISCELLANEOUS) ×3 IMPLANT
CUFF TOURNIQUET SINGLE 18IN (TOURNIQUET CUFF) ×3 IMPLANT
CUFF TOURNIQUET SINGLE 24IN (TOURNIQUET CUFF) IMPLANT
DRAPE C-ARM 42X72 X-RAY (DRAPES) ×3 IMPLANT
DRAPE INCISE IOBAN 66X45 STRL (DRAPES) ×3 IMPLANT
DRAPE PROXIMA HALF (DRAPES) ×6 IMPLANT
DRAPE SURG 17X23 STRL (DRAPES) ×3 IMPLANT
DRAPE U-SHAPE 47X51 STRL (DRAPES) ×3 IMPLANT
DRILL BIT 1.9X15MM (BIT) ×1
DRSG PAD ABDOMINAL 8X10 ST (GAUZE/BANDAGES/DRESSINGS) ×3 IMPLANT
ELECT CAUTERY BLADE 6.4 (BLADE) ×3 IMPLANT
GAUZE SPONGE 4X4 12PLY STRL (GAUZE/BANDAGES/DRESSINGS) ×3 IMPLANT
GAUZE XEROFORM 1X8 LF (GAUZE/BANDAGES/DRESSINGS) ×3 IMPLANT
GAUZE XEROFORM 5X9 LF (GAUZE/BANDAGES/DRESSINGS) ×3 IMPLANT
GLOVE BIOGEL PI IND STRL 6.5 (GLOVE) ×4 IMPLANT
GLOVE BIOGEL PI INDICATOR 6.5 (GLOVE) ×2
GLOVE NEODERM STRL 7.5 LF PF (GLOVE) ×2 IMPLANT
GLOVE SURG NEODERM 7.5  LF PF (GLOVE) ×1
GLOVE SURG SS PI 6.5 STRL IVOR (GLOVE) ×3 IMPLANT
GLOVE SURG SS PI 8.0 STRL IVOR (GLOVE) ×6 IMPLANT
GOWN STRL REIN XL XLG (GOWN DISPOSABLE) ×3 IMPLANT
GOWN STRL REUS W/ TWL LRG LVL3 (GOWN DISPOSABLE) ×2 IMPLANT
GOWN STRL REUS W/TWL 2XL LVL3 (GOWN DISPOSABLE) ×3 IMPLANT
GOWN STRL REUS W/TWL LRG LVL3 (GOWN DISPOSABLE) ×1
K-WIRE 1.6X150 (WIRE) ×1
K-WIRE FX150X1.6XKRSH (WIRE) ×2
K-WIRE OLIVE STOP 17 (WIRE) ×3 IMPLANT
K-WIRE SMOOTH 1.4X150 (WIRE) ×3
KIT BASIN OR (CUSTOM PROCEDURE TRAY) ×3 IMPLANT
KIT ROOM TURNOVER OR (KITS) ×3 IMPLANT
KWIRE FX150X1.6XKRSH (WIRE) ×2 IMPLANT
KWIRE SMOOTH 1.4X150 (WIRE) ×2 IMPLANT
NEEDLE 22X1 1/2 (OR ONLY) (NEEDLE) ×3 IMPLANT
NS IRRIG 1000ML POUR BTL (IV SOLUTION) ×3 IMPLANT
PACK ORTHO EXTREMITY (CUSTOM PROCEDURE TRAY) ×3 IMPLANT
PAD ARMBOARD 7.5X6 YLW CONV (MISCELLANEOUS) ×6 IMPLANT
PAD CAST 4YDX4 CTTN HI CHSV (CAST SUPPLIES) ×2 IMPLANT
PADDING CAST ABS 4INX4YD NS (CAST SUPPLIES) ×1
PADDING CAST ABS COTTON 4X4 ST (CAST SUPPLIES) ×2 IMPLANT
PADDING CAST COTTON 4X4 STRL (CAST SUPPLIES) ×1
PLATE EXTRASHORT LEFT NARROW (Plate) ×3 IMPLANT
PLATE OLECRANON 4H LEFT (Plate) ×3 IMPLANT
SCREW BONE 2.7X14 (Screw) ×3 IMPLANT
SCREW BONE 2.7X16MM (Screw) ×6 IMPLANT
SCREW BONE 2.7X22 (Screw) ×3 IMPLANT
SCREW BONE 3.5X20MM (Screw) ×3 IMPLANT
SCREW LOCKING 18MMX3.5MM (Screw) ×3 IMPLANT
SCREW LOCKING 2.7X18 (Screw) ×3 IMPLANT
SCREW LOCKING 2.7X22 (Screw) ×3 IMPLANT
SCREW LOCKING 3.5X48MM (Screw) ×3 IMPLANT
SCREW LOCKING 3.5X60MM (Screw) ×3 IMPLANT
SCREW NON LOCKING 22MM (Screw) ×3 IMPLANT
SET CYSTO W/LG BORE CLAMP LF (SET/KITS/TRAYS/PACK) ×3 IMPLANT
SLING ARM IMMOBILIZER XL (CAST SUPPLIES) ×3 IMPLANT
SPONGE LAP 4X18 X RAY DECT (DISPOSABLE) IMPLANT
STRIP CLOSURE SKIN 1/2X4 (GAUZE/BANDAGES/DRESSINGS) ×3 IMPLANT
SUCTION FRAZIER TIP 8 FR DISP (SUCTIONS) ×1
SUCTION TUBE FRAZIER 8FR DISP (SUCTIONS) ×2 IMPLANT
SUT ETHILON 4 0 PS 2 18 (SUTURE) IMPLANT
SUT MNCRL AB 4-0 PS2 18 (SUTURE) ×6 IMPLANT
SUT PROLENE 3 0 PS 2 (SUTURE) IMPLANT
SUT VIC AB 2-0 CT1 27 (SUTURE) ×2
SUT VIC AB 2-0 CT1 TAPERPNT 27 (SUTURE) ×4 IMPLANT
SUT VIC AB 3-0 FS2 27 (SUTURE) IMPLANT
SYR CONTROL 10ML LL (SYRINGE) IMPLANT
TOWEL OR 17X24 6PK STRL BLUE (TOWEL DISPOSABLE) ×3 IMPLANT
TOWEL OR 17X26 10 PK STRL BLUE (TOWEL DISPOSABLE) ×3 IMPLANT
TUBE CONNECTING 12X1/4 (SUCTIONS) ×3 IMPLANT
UNDERPAD 30X30 INCONTINENT (UNDERPADS AND DIAPERS) ×3 IMPLANT
YANKAUER SUCT BULB TIP NO VENT (SUCTIONS) ×3 IMPLANT

## 2014-11-28 NOTE — Discharge Instructions (Signed)
Postoperative instructions:  Weightbearing: non weight bearing  Dressing instructions: Keep your dressing and/or splint clean and dry at all times.  It will be removed at your first post-operative appointment.  Your stitches and/or staples will be removed at this visit.  Incision instructions:  Do not soak your incision for 3 weeks after surgery.  If the incision gets wet, pat dry and do not scrub the incision.  Pain control:  You have been given a prescription to be taken as directed for post-operative pain control.  In addition, elevate the operative extremity above the heart at all times to prevent swelling and throbbing pain.  Take over-the-counter Colace, 100mg  by mouth twice a day while taking narcotic pain medications to help prevent constipation.  Follow up appointments: 1) 10-14 days for suture removal and wound check. 2) Dr. Erlinda Hong as scheduled.   -------------------------------------------------------------------------------------------------------------  After Surgery Pain Control:  After your surgery, post-surgical discomfort or pain is likely. This discomfort can last several days to a few weeks. At certain times of the day your discomfort may be more intense.  Did you receive a nerve block?  A nerve block can provide pain relief for one hour to two days after your surgery. As long as the nerve block is working, you will experience little or no sensation in the area the surgeon operated on.  As the nerve block wears off, you will begin to experience pain or discomfort. It is very important that you begin taking your prescribed pain medication before the nerve block fully wears off. Treating your pain at the first sign of the block wearing off will ensure your pain is better controlled and more tolerable when full-sensation returns. Do not wait until the pain is intolerable, as the medicine will be less effective. It is better to treat pain in advance than to try and catch up.    General Anesthesia:  If you did not receive a nerve block during your surgery, you will need to start taking your pain medication shortly after your surgery and should continue to do so as prescribed by your surgeon.  Pain Medication:  Most commonly we prescribe Vicodin and Percocet for post-operative pain. Both of these medications contain a combination of acetaminophen (Tylenol) and a narcotic to help control pain.   It takes between 30 and 45 minutes before pain medication starts to work. It is important to take your medication before your pain level gets too intense.   Nausea is a common side effect of many pain medications. You will want to eat something before taking your pain medicine to help prevent nausea.   If you are taking a prescription pain medication that contains acetaminophen, we recommend that you do not take additional over the counter acetaminophen (Tylenol).  Other pain relieving options:   Using a cold pack to ice the affected area a few times a day (15 to 20 minutes at a time) can help to relieve pain, reduce swelling and bruising.   Elevation of the affected area can also help to reduce pain and swelling.

## 2014-11-28 NOTE — H&P (Signed)
    PREOPERATIVE H&P  Chief Complaint: left distal radius fracture, left olecranon fracture  HPI: Andrea Fritz is a 64 y.o. female who presents for surgical treatment of left distal radius fracture, left olecranon fracture.  She denies any changes in medical history.  Past Medical History  Diagnosis Date  . Thyroid disease     Hypothyroidism   Past Surgical History  Procedure Laterality Date  . Abdominal hysterectomy     Social History   Social History  . Marital Status: Married    Spouse Name: N/A  . Number of Children: N/A  . Years of Education: N/A   Social History Main Topics  . Smoking status: Never Smoker   . Smokeless tobacco: Never Used  . Alcohol Use: No  . Drug Use: No  . Sexual Activity: Not on file   Other Topics Concern  . Not on file   Social History Narrative  . No narrative on file   No family history on file. Allergies  Allergen Reactions  . Red Clover [Trifolium Pratense] Hives  . Tetracyclines & Related Rash   Prior to Admission medications   Medication Sig Start Date End Date Taking? Authorizing Provider  amitriptyline (ELAVIL) 10 MG tablet Take 10 mg by mouth at bedtime.    Historical Provider, MD  aspirin 81 MG tablet Take 81 mg by mouth daily.    Historical Provider, MD  FLUoxetine (PROZAC) 40 MG capsule Take 40 mg by mouth daily.    Historical Provider, MD  HYDROcodone-acetaminophen (NORCO/VICODIN) 5-325 MG tablet Take 1-2 tablets by mouth every 6 (six) hours as needed. 11/27/14   Blanchie Dessert, MD  levothyroxine (SYNTHROID, LEVOTHROID) 50 MCG tablet Take 50 mcg by mouth daily before breakfast.    Historical Provider, MD  simvastatin (ZOCOR) 40 MG tablet Take 40 mg by mouth daily.    Historical Provider, MD     Positive ROS: All other systems have been reviewed and were otherwise negative with the exception of those mentioned in the HPI and as above.  Physical Exam: General: Alert, no acute distress Cardiovascular: No pedal  edema Respiratory: No cyanosis, no use of accessory musculature GI: abdomen soft Skin: No lesions in the area of chief complaint Neurologic: Sensation intact distally Psychiatric: Patient is competent for consent with normal mood and affect Lymphatic: no lymphedema  MUSCULOSKELETAL: exam stable  Assessment: left distal radius fracture, left olecranon fracture  Plan: Plan for Procedure(s): OPEN REDUCTION INTERNAL FIXATION (ORIF) LEFT DISTAL RADIUS FRACTURE AND LEFT OLECRANON FRACTURE OPEN REDUCTION INTERNAL FIXATION (ORIF) ELBOW/OLECRANON FRACTURE  The risks benefits and alternatives were discussed with the patient including but not limited to the risks of nonoperative treatment, versus surgical intervention including infection, bleeding, nerve injury,  blood clots, cardiopulmonary complications, morbidity, mortality, among others, and they were willing to proceed.   Marianna Payment, MD   11/28/2014 6:39 AM

## 2014-11-28 NOTE — Transfer of Care (Signed)
Immediate Anesthesia Transfer of Care Note  Patient: Andrea Fritz  Procedure(s) Performed: Procedure(s): OPEN REDUCTION INTERNAL FIXATION (ORIF) LEFT DISTAL RADIUS FRACTURE AND LEFT OLECRANON FRACTURE (Left) OPEN REDUCTION INTERNAL FIXATION (ORIF) ELBOW/OLECRANON FRACTURE (Left)  Patient Location: PACU  Anesthesia Type:General  Level of Consciousness: sedated  Airway & Oxygen Therapy: Patient Spontanous Breathing and Patient connected to nasal cannula oxygen  Post-op Assessment: Report given to RN and Post -op Vital signs reviewed and stable  Post vital signs: Reviewed and stable  Last Vitals:  Filed Vitals:   11/28/14 1455  BP: 130/67  Pulse: 82  Temp:   Resp: 16    Complications: No apparent anesthesia complications

## 2014-11-28 NOTE — Anesthesia Preprocedure Evaluation (Signed)
Anesthesia Evaluation  Patient identified by MRN, date of birth, ID band Patient awake    Reviewed: Allergy & Precautions, NPO status , Patient's Chart, lab work & pertinent test results  History of Anesthesia Complications (+) PONV and history of anesthetic complications  Airway Mallampati: II  TM Distance: >3 FB Neck ROM: Full    Dental  (+) Teeth Intact   Pulmonary neg pulmonary ROS,    breath sounds clear to auscultation       Cardiovascular negative cardio ROS   Rhythm:Regular     Neuro/Psych negative neurological ROS  negative psych ROS   GI/Hepatic negative GI ROS, Neg liver ROS,   Endo/Other  Hypothyroidism   Renal/GU negative Renal ROS     Musculoskeletal   Abdominal   Peds  Hematology negative hematology ROS (+)   Anesthesia Other Findings   Reproductive/Obstetrics                             Anesthesia Physical Anesthesia Plan  ASA: II  Anesthesia Plan: Regional and General   Post-op Pain Management:    Induction: Intravenous  Airway Management Planned: LMA  Additional Equipment: None  Intra-op Plan:   Post-operative Plan: Extubation in OR  Informed Consent: I have reviewed the patients History and Physical, chart, labs and discussed the procedure including the risks, benefits and alternatives for the proposed anesthesia with the patient or authorized representative who has indicated his/her understanding and acceptance.   Dental advisory given  Plan Discussed with: CRNA and Surgeon  Anesthesia Plan Comments:         Anesthesia Quick Evaluation

## 2014-11-28 NOTE — Anesthesia Procedure Notes (Addendum)
Anesthesia Regional Block:  Supraclavicular block  Pre-Anesthetic Checklist: ,, timeout performed, Correct Patient, Correct Site, Correct Laterality, Correct Procedure, Correct Position, site marked, Risks and benefits discussed,  Surgical consent,  Pre-op evaluation,  At surgeon's request and post-op pain management  Laterality: Upper and Left  Prep: chloraprep       Needles:  Injection technique: Single-shot  Needle Type: Echogenic Needle          Additional Needles:  Procedures: ultrasound guided (picture in chart) Supraclavicular block Narrative:  Injection made incrementally with aspirations every 5 mL.  Performed by: Personally   Additional Notes: H+P and labs reviewed, risks and benefits discussed with patient, procedure tolerated well without complications   Procedure Name: LMA Insertion Date/Time: 11/28/2014 3:18 PM Performed by: Neldon Newport Pre-anesthesia Checklist: Patient being monitored, Suction available, Emergency Drugs available, Patient identified and Timeout performed Patient Re-evaluated:Patient Re-evaluated prior to inductionOxygen Delivery Method: Circle system utilized Preoxygenation: Pre-oxygenation with 100% oxygen Intubation Type: IV induction Ventilation: Mask ventilation without difficulty LMA: LMA inserted LMA Size: 4.0 Number of attempts: 1 Placement Confirmation: positive ETCO2 and breath sounds checked- equal and bilateral Tube secured with: Tape Dental Injury: Teeth and Oropharynx as per pre-operative assessment

## 2014-11-28 NOTE — Anesthesia Postprocedure Evaluation (Signed)
Anesthesia Post Note  Patient: Andrea Fritz  Procedure(s) Performed: Procedure(s) (LRB): OPEN REDUCTION INTERNAL FIXATION (ORIF) LEFT DISTAL RADIUS FRACTURE AND LEFT OLECRANON FRACTURE (Left) OPEN REDUCTION INTERNAL FIXATION (ORIF) ELBOW/OLECRANON FRACTURE (Left)  Anesthesia type: general  Patient location: PACU  Post pain: Pain level controlled  Post assessment: Patient's Cardiovascular Status Stable  Last Vitals:  Filed Vitals:   11/28/14 1737  BP: 116/59  Pulse:   Temp: 36.9 C  Resp: 17    Post vital signs: Reviewed and stable  Level of consciousness: sedated  Complications: No apparent anesthesia complications

## 2014-11-28 NOTE — Op Note (Signed)
   Date of Surgery: 11/28/2014  INDICATIONS: Andrea Fritz is a 64 y.o.-year-old female with a left distal radius and olecranon fracture from a fall;  The patient did consent to the procedure after discussion of the risks and benefits.  PREOPERATIVE DIAGNOSIS: 1. Left distal radius fracture 2. Left olecranon fracture 3. Left nondisplaced radial head fracture 4. Left triquetral avulsion fracture  POSTOPERATIVE DIAGNOSIS: Same.  PROCEDURE:  1. Open reduction and internal fixation of left distal radius fracture more than 3 fragments 2. Open reduction and internal fixation of left olecranon fracture, separate incision 3. Closed treatment of left radial head fracture 4. Closed treatment of left triquetral fracture  SURGEON: N. Eduard Roux, M.D.  ASSIST: Joretta Bachelor, RNFA.  ANESTHESIA:  general, regional  IV FLUIDS AND URINE: See anesthesia.  ESTIMATED BLOOD LOSS: Minimal mL.  IMPLANTS: [employer]  DRAINS: None  COMPLICATIONS: None.  DESCRIPTION OF PROCEDURE: The patient was brought to the operating room and placed supine on the operating table.  The patient had been signed prior to the procedure and this was documented. The patient had the anesthesia placed by the anesthesiologist.  A time-out was performed to confirm that this was the correct patient, site, side and location. The patient did receive antibiotics prior to the incision and was re-dosed during the procedure as needed at indicated intervals.  A tourniquet placed.  The patient had the operative extremity prepped and draped in the standard surgical fashion.    The tourniquet was inflated to 250 mmHg. We began with the distal radius. A standard volar FCR approach was used. The pronator quadratus was elevated off of the distal radius. The fracture was exposed. Reduction was obtained with traction and manual maneuvers. This was held in place with a K wire. The reduction was confirmed under fluoroscopy. We then found the appropriate  volar distal radius plate and placed at the appropriate height and position. A series of locking and nonlocking screws were placed in the shaft and distally in the fracture fragment. Final x-rays were taken. Care was taken not to place screws in the wrist joint. We then turned our attention to the olecranon. A posterior incision was used over the elbow. Blunt dissection was carried down to the fascia. Full-thickness flaps were created. The proximal ulna was exposed. The fracture was also then exposed. Organized hematoma was removed. There was comminution of the olecranon fracture which required [place] to fix the fracture with a plate. We're able to obtain a reduction and this was held with a K wire. This was confirmed under fluoroscopy. We then the appropriate sized plate on the olecranon. We able to gain some compression across the fracture site by placing a nonlocking screw in the shaft in compression mode. We then placed a nonlocking and locking screws in the shaft and in the olecranon using fluoroscopy. Final x-rays were taken. The wounds were thoroughly irrigated with normal saline and closed in a layered fashion using 2-0 Vicryl and running 4-0 Monocryl. Sterile dressings were applied. The tourniquet was deflated. The arm was immobilized in a long-arm splint and volar wrist splint. Patient was x-rayed and transferred to the PACU in stable condition.  POSTOPERATIVE PLAN: Patient will be discharged home. She will be nonweightbearing to the left upper extremity. We will see her in the office in 2 weeks for a postop check.  Azucena Cecil, MD [facility] [phone] 5:35 PM

## 2014-11-28 NOTE — Progress Notes (Signed)
John, CRNA at bedside.

## 2014-11-29 ENCOUNTER — Encounter (HOSPITAL_COMMUNITY): Payer: Self-pay | Admitting: Orthopaedic Surgery

## 2014-12-06 ENCOUNTER — Encounter (HOSPITAL_COMMUNITY): Payer: Self-pay | Admitting: Orthopaedic Surgery

## 2015-09-19 ENCOUNTER — Other Ambulatory Visit: Payer: Self-pay | Admitting: Internal Medicine

## 2015-09-19 DIAGNOSIS — Z1231 Encounter for screening mammogram for malignant neoplasm of breast: Secondary | ICD-10-CM

## 2015-10-11 ENCOUNTER — Ambulatory Visit
Admission: RE | Admit: 2015-10-11 | Discharge: 2015-10-11 | Disposition: A | Discharge status: Home / Self Care | Payer: BC Managed Care – PPO | Source: Ambulatory Visit | Attending: Internal Medicine | Admitting: Internal Medicine

## 2015-10-11 DIAGNOSIS — Z1231 Encounter for screening mammogram for malignant neoplasm of breast: Secondary | ICD-10-CM

## 2016-07-14 DIAGNOSIS — R69 Illness, unspecified: Secondary | ICD-10-CM | POA: Diagnosis not present

## 2016-10-01 ENCOUNTER — Other Ambulatory Visit: Payer: Self-pay | Admitting: Internal Medicine

## 2016-10-01 DIAGNOSIS — Z1239 Encounter for other screening for malignant neoplasm of breast: Secondary | ICD-10-CM

## 2016-10-08 DIAGNOSIS — Z6841 Body Mass Index (BMI) 40.0 and over, adult: Secondary | ICD-10-CM | POA: Diagnosis not present

## 2016-10-08 DIAGNOSIS — E049 Nontoxic goiter, unspecified: Secondary | ICD-10-CM | POA: Diagnosis not present

## 2016-10-08 DIAGNOSIS — Z23 Encounter for immunization: Secondary | ICD-10-CM | POA: Diagnosis not present

## 2016-10-08 DIAGNOSIS — E7800 Pure hypercholesterolemia, unspecified: Secondary | ICD-10-CM | POA: Diagnosis not present

## 2016-10-08 DIAGNOSIS — I10 Essential (primary) hypertension: Secondary | ICD-10-CM | POA: Diagnosis not present

## 2016-10-08 DIAGNOSIS — R799 Abnormal finding of blood chemistry, unspecified: Secondary | ICD-10-CM | POA: Diagnosis not present

## 2016-10-08 DIAGNOSIS — Z0000 Encounter for general adult medical examination without abnormal findings: Secondary | ICD-10-CM | POA: Diagnosis not present

## 2016-10-08 DIAGNOSIS — E039 Hypothyroidism, unspecified: Secondary | ICD-10-CM | POA: Diagnosis not present

## 2016-10-13 ENCOUNTER — Ambulatory Visit
Admission: RE | Admit: 2016-10-13 | Discharge: 2016-10-13 | Disposition: A | Discharge status: Home / Self Care | Payer: Medicare HMO | Source: Ambulatory Visit | Attending: Internal Medicine | Admitting: Internal Medicine

## 2016-10-13 DIAGNOSIS — Z1231 Encounter for screening mammogram for malignant neoplasm of breast: Secondary | ICD-10-CM | POA: Diagnosis not present

## 2016-10-13 DIAGNOSIS — Z1239 Encounter for other screening for malignant neoplasm of breast: Secondary | ICD-10-CM

## 2016-10-14 ENCOUNTER — Other Ambulatory Visit: Payer: Self-pay | Admitting: Internal Medicine

## 2016-10-14 DIAGNOSIS — R928 Other abnormal and inconclusive findings on diagnostic imaging of breast: Secondary | ICD-10-CM

## 2016-10-20 ENCOUNTER — Ambulatory Visit
Admission: RE | Admit: 2016-10-20 | Discharge: 2016-10-20 | Disposition: A | Discharge status: Home / Self Care | Payer: Medicare HMO | Source: Ambulatory Visit | Attending: Internal Medicine | Admitting: Internal Medicine

## 2016-10-20 DIAGNOSIS — R928 Other abnormal and inconclusive findings on diagnostic imaging of breast: Secondary | ICD-10-CM

## 2016-10-20 DIAGNOSIS — N6321 Unspecified lump in the left breast, upper outer quadrant: Secondary | ICD-10-CM | POA: Diagnosis not present

## 2016-12-01 DIAGNOSIS — R69 Illness, unspecified: Secondary | ICD-10-CM | POA: Diagnosis not present

## 2017-04-21 DIAGNOSIS — R69 Illness, unspecified: Secondary | ICD-10-CM | POA: Diagnosis not present

## 2017-10-11 DIAGNOSIS — R8290 Unspecified abnormal findings in urine: Secondary | ICD-10-CM | POA: Diagnosis not present

## 2017-10-11 DIAGNOSIS — Z6841 Body Mass Index (BMI) 40.0 and over, adult: Secondary | ICD-10-CM | POA: Diagnosis not present

## 2017-10-11 DIAGNOSIS — E785 Hyperlipidemia, unspecified: Secondary | ICD-10-CM | POA: Diagnosis not present

## 2017-10-11 DIAGNOSIS — E039 Hypothyroidism, unspecified: Secondary | ICD-10-CM | POA: Diagnosis not present

## 2017-10-11 DIAGNOSIS — E049 Nontoxic goiter, unspecified: Secondary | ICD-10-CM | POA: Diagnosis not present

## 2017-10-11 DIAGNOSIS — D239 Other benign neoplasm of skin, unspecified: Secondary | ICD-10-CM | POA: Diagnosis not present

## 2017-10-12 ENCOUNTER — Other Ambulatory Visit: Payer: Self-pay | Admitting: Internal Medicine

## 2017-10-12 DIAGNOSIS — Z1231 Encounter for screening mammogram for malignant neoplasm of breast: Secondary | ICD-10-CM

## 2017-10-18 ENCOUNTER — Ambulatory Visit
Admission: RE | Admit: 2017-10-18 | Discharge: 2017-10-18 | Disposition: A | Discharge status: Home / Self Care | Payer: Medicare HMO | Source: Ambulatory Visit | Attending: Internal Medicine | Admitting: Internal Medicine

## 2017-10-18 DIAGNOSIS — Z1231 Encounter for screening mammogram for malignant neoplasm of breast: Secondary | ICD-10-CM | POA: Diagnosis not present

## 2017-12-06 DIAGNOSIS — R69 Illness, unspecified: Secondary | ICD-10-CM | POA: Diagnosis not present

## 2018-05-17 DIAGNOSIS — R69 Illness, unspecified: Secondary | ICD-10-CM | POA: Diagnosis not present

## 2018-08-08 DIAGNOSIS — R69 Illness, unspecified: Secondary | ICD-10-CM | POA: Diagnosis not present

## 2018-08-23 DIAGNOSIS — R69 Illness, unspecified: Secondary | ICD-10-CM | POA: Diagnosis not present

## 2018-08-24 DIAGNOSIS — R69 Illness, unspecified: Secondary | ICD-10-CM | POA: Diagnosis not present

## 2018-09-19 ENCOUNTER — Other Ambulatory Visit: Payer: Self-pay | Admitting: Internal Medicine

## 2018-09-19 DIAGNOSIS — Z1231 Encounter for screening mammogram for malignant neoplasm of breast: Secondary | ICD-10-CM

## 2018-10-14 DIAGNOSIS — I8001 Phlebitis and thrombophlebitis of superficial vessels of right lower extremity: Secondary | ICD-10-CM | POA: Diagnosis not present

## 2018-10-14 DIAGNOSIS — D229 Melanocytic nevi, unspecified: Secondary | ICD-10-CM | POA: Diagnosis not present

## 2018-10-14 DIAGNOSIS — E7800 Pure hypercholesterolemia, unspecified: Secondary | ICD-10-CM | POA: Diagnosis not present

## 2018-10-14 DIAGNOSIS — D559 Anemia due to enzyme disorder, unspecified: Secondary | ICD-10-CM | POA: Diagnosis not present

## 2018-10-14 DIAGNOSIS — Z23 Encounter for immunization: Secondary | ICD-10-CM | POA: Diagnosis not present

## 2018-10-14 DIAGNOSIS — E039 Hypothyroidism, unspecified: Secondary | ICD-10-CM | POA: Diagnosis not present

## 2018-10-14 DIAGNOSIS — I10 Essential (primary) hypertension: Secondary | ICD-10-CM | POA: Diagnosis not present

## 2018-10-31 ENCOUNTER — Other Ambulatory Visit: Payer: Self-pay

## 2018-10-31 ENCOUNTER — Ambulatory Visit
Admission: RE | Admit: 2018-10-31 | Discharge: 2018-10-31 | Disposition: A | Discharge status: Home / Self Care | Payer: Medicare HMO | Source: Ambulatory Visit | Attending: Internal Medicine | Admitting: Internal Medicine

## 2018-10-31 DIAGNOSIS — Z1231 Encounter for screening mammogram for malignant neoplasm of breast: Secondary | ICD-10-CM

## 2019-03-22 DIAGNOSIS — E042 Nontoxic multinodular goiter: Secondary | ICD-10-CM | POA: Diagnosis not present

## 2019-03-22 DIAGNOSIS — E038 Other specified hypothyroidism: Secondary | ICD-10-CM | POA: Diagnosis not present

## 2019-03-22 DIAGNOSIS — Z1331 Encounter for screening for depression: Secondary | ICD-10-CM | POA: Diagnosis not present

## 2019-03-22 DIAGNOSIS — N951 Menopausal and female climacteric states: Secondary | ICD-10-CM | POA: Diagnosis not present

## 2019-03-22 DIAGNOSIS — E7849 Other hyperlipidemia: Secondary | ICD-10-CM | POA: Diagnosis not present

## 2019-03-22 DIAGNOSIS — D239 Other benign neoplasm of skin, unspecified: Secondary | ICD-10-CM | POA: Diagnosis not present

## 2019-03-22 DIAGNOSIS — Z808 Family history of malignant neoplasm of other organs or systems: Secondary | ICD-10-CM | POA: Diagnosis not present

## 2019-03-26 ENCOUNTER — Ambulatory Visit: Payer: Medicare HMO | Attending: Internal Medicine

## 2019-03-26 DIAGNOSIS — Z23 Encounter for immunization: Secondary | ICD-10-CM | POA: Insufficient documentation

## 2019-03-26 NOTE — Progress Notes (Signed)
   Covid-19 Vaccination Clinic  Name:  Andrea Fritz    MRN: [id number] DOB: Jun 14, 1950  03/26/2019  Andrea Fritz was observed post Covid-19 immunization for 15 minutes without incidence. She was provided with Vaccine Information Sheet and instruction to access the V-Safe system.   Andrea Fritz was instructed to call 911 with any severe reactions post vaccine: Marland Kitchen Difficulty breathing  . Swelling of your face and throat  . A fast heartbeat  . A bad rash all over your body  . Dizziness and weakness    Immunizations Administered    Name Date Dose VIS Date Route   Pfizer COVID-19 Vaccine 03/26/2019  2:10 PM 0.3 mL 01/13/2019 Intramuscular   Manufacturer: [place]   Lot: [id number]   [place]: [id number]

## 2019-04-11 DIAGNOSIS — Z780 Asymptomatic menopausal state: Secondary | ICD-10-CM | POA: Diagnosis not present

## 2019-04-19 ENCOUNTER — Ambulatory Visit: Payer: Medicare HMO | Attending: Internal Medicine

## 2019-04-19 DIAGNOSIS — Z23 Encounter for immunization: Secondary | ICD-10-CM

## 2019-04-19 NOTE — Progress Notes (Signed)
   Covid-19 Vaccination Clinic  Name:  Martinique Mcmeen    MRN: [id number] DOB: 13-Jun-1950  04/19/2019  Ms. Demby was observed post Covid-19 immunization for 15 minutes without incident. She was provided with Vaccine Information Sheet and instruction to access the V-Safe system.   Ms. Lesperance was instructed to call 911 with any severe reactions post vaccine: Marland Kitchen Difficulty breathing  . Swelling of face and throat  . A fast heartbeat  . A bad rash all over body  . Dizziness and weakness   Immunizations Administered    Name Date Dose VIS Date Route   Pfizer COVID-19 Vaccine 04/19/2019  1:20 PM 0.3 mL 01/13/2019 Intramuscular   Manufacturer: [place]   Lot: [id number]   [place]: [id number]

## 2019-05-09 DIAGNOSIS — L821 Other seborrheic keratosis: Secondary | ICD-10-CM | POA: Diagnosis not present

## 2019-05-09 DIAGNOSIS — D2262 Melanocytic nevi of left upper limb, including shoulder: Secondary | ICD-10-CM | POA: Diagnosis not present

## 2019-05-09 DIAGNOSIS — D225 Melanocytic nevi of trunk: Secondary | ICD-10-CM | POA: Diagnosis not present

## 2019-05-09 DIAGNOSIS — D2261 Melanocytic nevi of right upper limb, including shoulder: Secondary | ICD-10-CM | POA: Diagnosis not present

## 2019-08-10 DIAGNOSIS — D225 Melanocytic nevi of trunk: Secondary | ICD-10-CM | POA: Diagnosis not present

## 2019-08-10 DIAGNOSIS — L918 Other hypertrophic disorders of the skin: Secondary | ICD-10-CM | POA: Diagnosis not present

## 2019-08-10 DIAGNOSIS — L821 Other seborrheic keratosis: Secondary | ICD-10-CM | POA: Diagnosis not present

## 2019-08-10 DIAGNOSIS — D485 Neoplasm of uncertain behavior of skin: Secondary | ICD-10-CM | POA: Diagnosis not present

## 2019-08-10 DIAGNOSIS — L820 Inflamed seborrheic keratosis: Secondary | ICD-10-CM | POA: Diagnosis not present

## 2019-08-10 DIAGNOSIS — D1809 Hemangioma of other sites: Secondary | ICD-10-CM | POA: Diagnosis not present

## 2019-08-10 DIAGNOSIS — L814 Other melanin hyperpigmentation: Secondary | ICD-10-CM | POA: Diagnosis not present

## 2019-09-19 ENCOUNTER — Other Ambulatory Visit: Payer: Self-pay | Admitting: Internal Medicine

## 2019-09-19 DIAGNOSIS — Z1231 Encounter for screening mammogram for malignant neoplasm of breast: Secondary | ICD-10-CM

## 2019-10-18 DIAGNOSIS — E039 Hypothyroidism, unspecified: Secondary | ICD-10-CM | POA: Diagnosis not present

## 2019-10-18 DIAGNOSIS — E785 Hyperlipidemia, unspecified: Secondary | ICD-10-CM | POA: Diagnosis not present

## 2019-10-24 DIAGNOSIS — G4700 Insomnia, unspecified: Secondary | ICD-10-CM | POA: Diagnosis not present

## 2019-10-24 DIAGNOSIS — Z0000 Encounter for general adult medical examination without abnormal findings: Secondary | ICD-10-CM | POA: Diagnosis not present

## 2019-10-24 DIAGNOSIS — S62102D Fracture of unspecified carpal bone, left wrist, subsequent encounter for fracture with routine healing: Secondary | ICD-10-CM | POA: Diagnosis not present

## 2019-10-24 DIAGNOSIS — N951 Menopausal and female climacteric states: Secondary | ICD-10-CM | POA: Diagnosis not present

## 2019-10-24 DIAGNOSIS — Z23 Encounter for immunization: Secondary | ICD-10-CM | POA: Diagnosis not present

## 2019-10-24 DIAGNOSIS — R3121 Asymptomatic microscopic hematuria: Secondary | ICD-10-CM | POA: Diagnosis not present

## 2019-10-24 DIAGNOSIS — E785 Hyperlipidemia, unspecified: Secondary | ICD-10-CM | POA: Diagnosis not present

## 2019-10-24 DIAGNOSIS — E669 Obesity, unspecified: Secondary | ICD-10-CM | POA: Diagnosis not present

## 2019-10-24 DIAGNOSIS — R82998 Other abnormal findings in urine: Secondary | ICD-10-CM | POA: Diagnosis not present

## 2019-10-24 DIAGNOSIS — Z808 Family history of malignant neoplasm of other organs or systems: Secondary | ICD-10-CM | POA: Diagnosis not present

## 2019-10-24 DIAGNOSIS — J302 Other seasonal allergic rhinitis: Secondary | ICD-10-CM | POA: Diagnosis not present

## 2019-10-24 DIAGNOSIS — D239 Other benign neoplasm of skin, unspecified: Secondary | ICD-10-CM | POA: Diagnosis not present

## 2019-10-31 DIAGNOSIS — Z1212 Encounter for screening for malignant neoplasm of rectum: Secondary | ICD-10-CM | POA: Diagnosis not present

## 2019-11-01 ENCOUNTER — Ambulatory Visit
Admission: RE | Admit: 2019-11-01 | Discharge: 2019-11-01 | Disposition: A | Discharge status: Home / Self Care | Payer: Medicare HMO | Source: Ambulatory Visit | Attending: Internal Medicine | Admitting: Internal Medicine

## 2019-11-01 ENCOUNTER — Other Ambulatory Visit: Payer: Self-pay

## 2019-11-01 DIAGNOSIS — Z1231 Encounter for screening mammogram for malignant neoplasm of breast: Secondary | ICD-10-CM

## 2019-12-06 DIAGNOSIS — Z23 Encounter for immunization: Secondary | ICD-10-CM | POA: Diagnosis not present

## 2020-02-05 DIAGNOSIS — Z23 Encounter for immunization: Secondary | ICD-10-CM | POA: Diagnosis not present

## 2020-02-12 DIAGNOSIS — D225 Melanocytic nevi of trunk: Secondary | ICD-10-CM | POA: Diagnosis not present

## 2020-02-12 DIAGNOSIS — Z85820 Personal history of malignant melanoma of skin: Secondary | ICD-10-CM | POA: Diagnosis not present

## 2020-02-12 DIAGNOSIS — L905 Scar conditions and fibrosis of skin: Secondary | ICD-10-CM | POA: Diagnosis not present

## 2020-02-12 DIAGNOSIS — L821 Other seborrheic keratosis: Secondary | ICD-10-CM | POA: Diagnosis not present

## 2020-02-12 DIAGNOSIS — D1801 Hemangioma of skin and subcutaneous tissue: Secondary | ICD-10-CM | POA: Diagnosis not present

## 2020-02-12 DIAGNOSIS — C434 Malignant melanoma of scalp and neck: Secondary | ICD-10-CM | POA: Diagnosis not present

## 2020-02-12 DIAGNOSIS — D2262 Melanocytic nevi of left upper limb, including shoulder: Secondary | ICD-10-CM | POA: Diagnosis not present

## 2020-02-12 DIAGNOSIS — D2261 Melanocytic nevi of right upper limb, including shoulder: Secondary | ICD-10-CM | POA: Diagnosis not present

## 2020-02-26 DIAGNOSIS — D034 Melanoma in situ of scalp and neck: Secondary | ICD-10-CM | POA: Diagnosis not present

## 2020-02-26 DIAGNOSIS — C434 Malignant melanoma of scalp and neck: Secondary | ICD-10-CM | POA: Diagnosis not present

## 2020-02-29 DIAGNOSIS — C434 Malignant melanoma of scalp and neck: Secondary | ICD-10-CM | POA: Diagnosis not present

## 2020-02-29 DIAGNOSIS — D034 Melanoma in situ of scalp and neck: Secondary | ICD-10-CM | POA: Diagnosis not present

## 2020-05-28 IMAGING — MG MM DIGITAL SCREENING BILAT W/ TOMO W/ CAD
8 series · 8 of 24 positions shown · non-contrast
Comparison: Previous exam(s).

CLINICAL DATA: Screening.

EXAM:
DIGITAL SCREENING BILATERAL MAMMOGRAM WITH TOMO AND CAD

[R CC synth-2D]
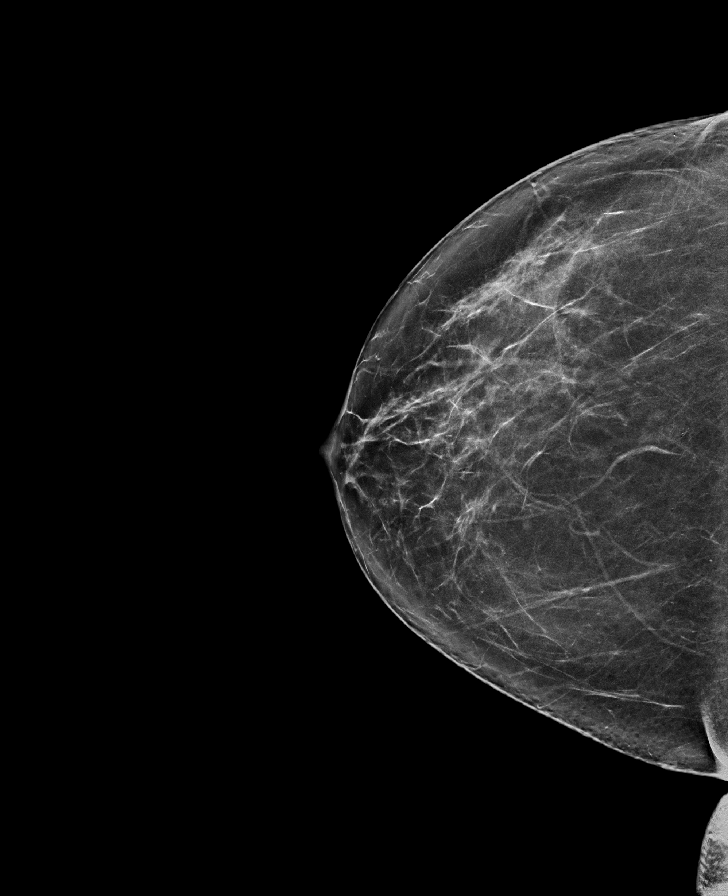

[L CC synth-2D]
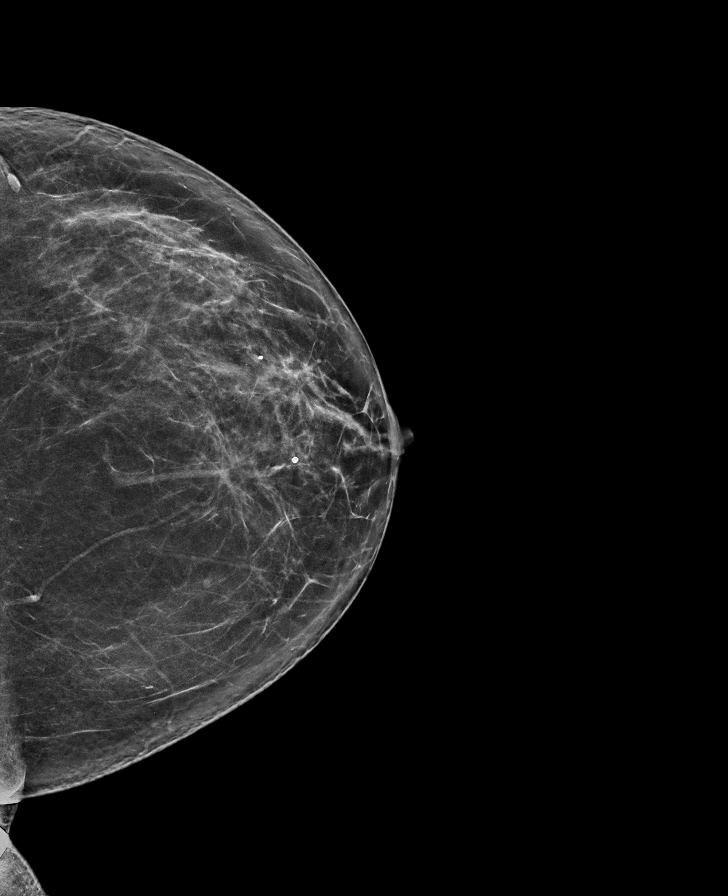

[L MLO synth-2D]
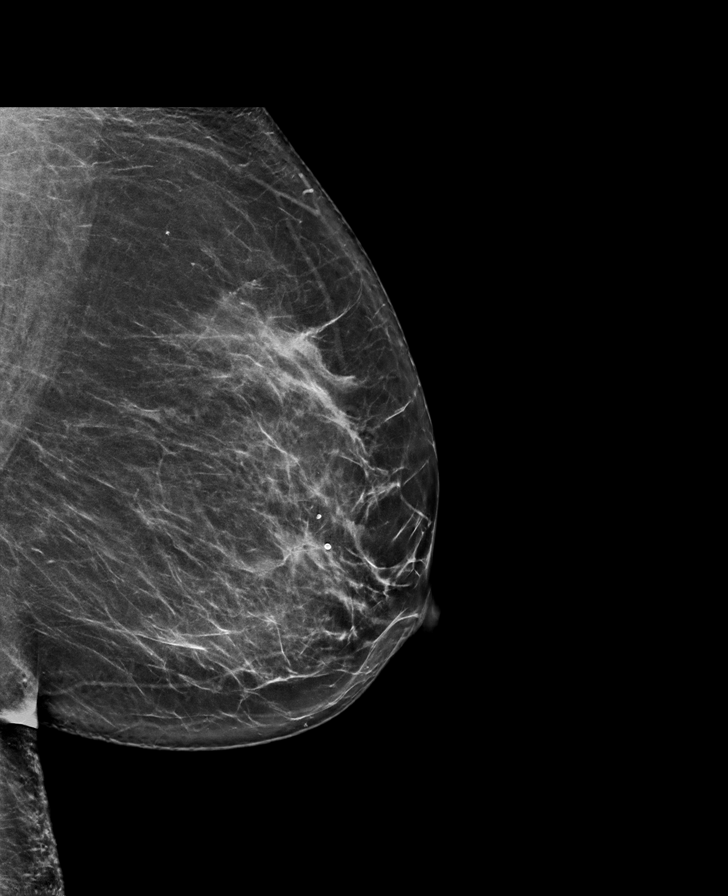

[R MLO synth-2D]
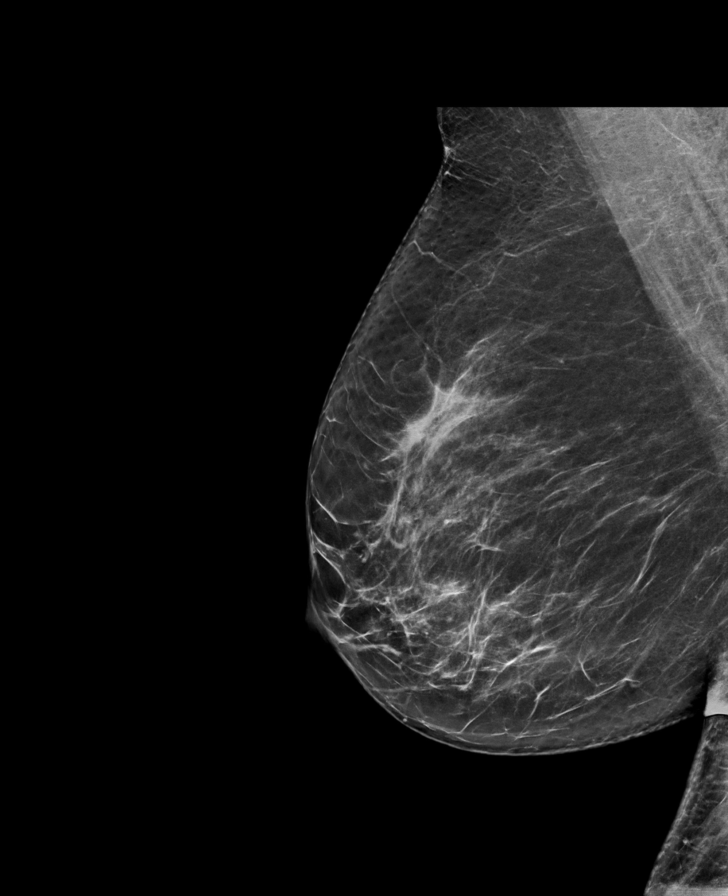

[L CC tomo · tomo slice 39/77.0]
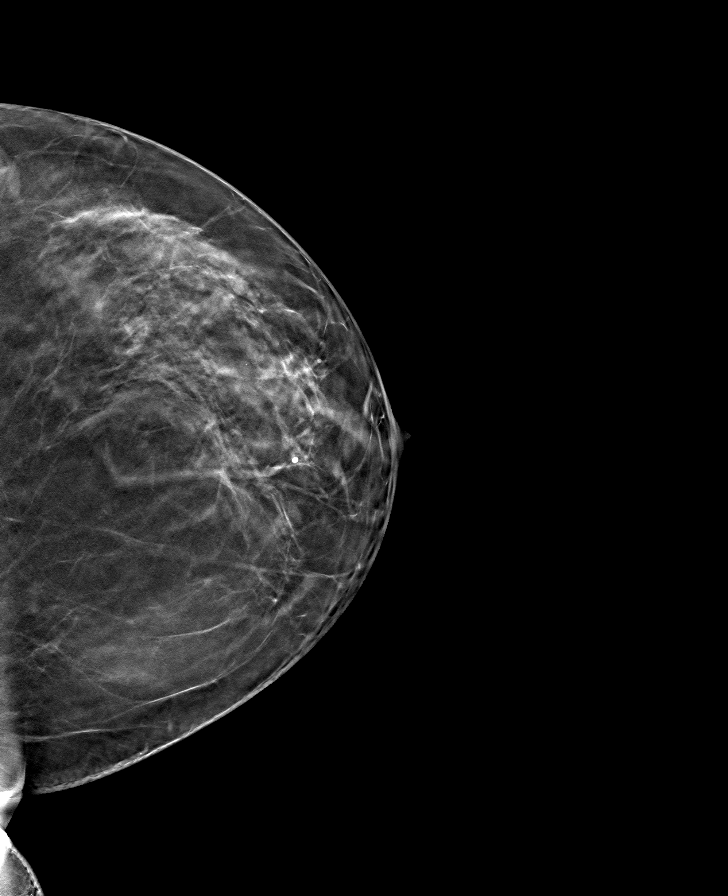

[L MLO tomo · tomo slice 41/81.0]
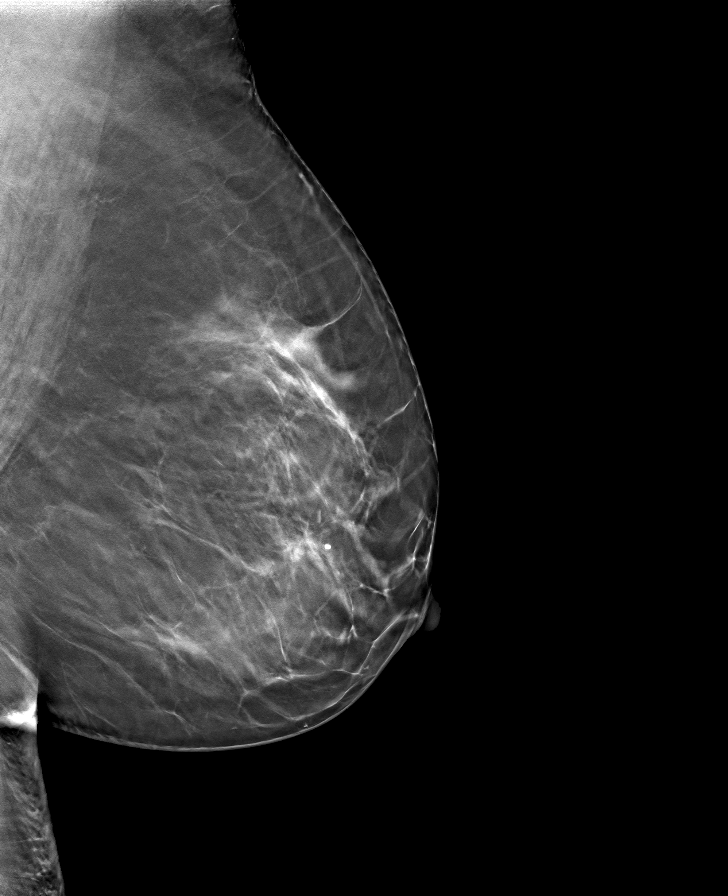

[R MLO tomo · tomo slice 40/79.0]
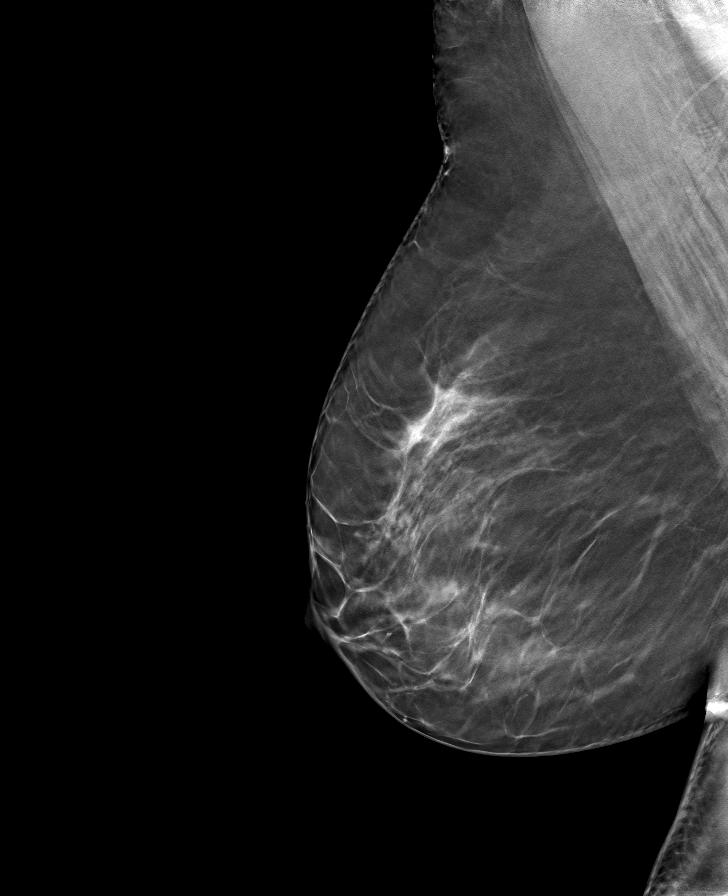

[R CC tomo · tomo slice 39/78.0]
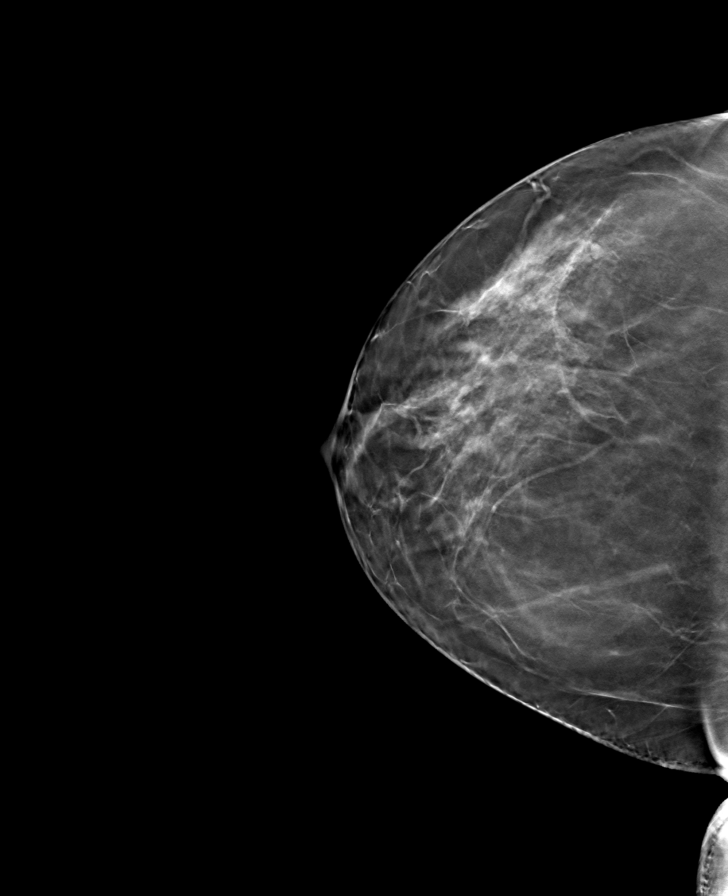

[8 of 24 positions shown; findings below may reference images not displayed]

ACR Breast Density Category b: There are scattered areas of
fibroglandular density.
FINDINGS: There are no findings suspicious for malignancy. Images were
processed with CAD.
IMPRESSION: No mammographic evidence of malignancy. A result letter of this
screening mammogram will be mailed directly to the patient.

RECOMMENDATION:
Screening mammogram in one year. (Code:[id number])

BI-RADS CATEGORY  1: Negative.

## 2020-06-11 DIAGNOSIS — L738 Other specified follicular disorders: Secondary | ICD-10-CM | POA: Diagnosis not present

## 2020-06-11 DIAGNOSIS — D225 Melanocytic nevi of trunk: Secondary | ICD-10-CM | POA: Diagnosis not present

## 2020-06-11 DIAGNOSIS — L821 Other seborrheic keratosis: Secondary | ICD-10-CM | POA: Diagnosis not present

## 2020-06-11 DIAGNOSIS — Z85820 Personal history of malignant melanoma of skin: Secondary | ICD-10-CM | POA: Diagnosis not present

## 2020-06-11 DIAGNOSIS — L570 Actinic keratosis: Secondary | ICD-10-CM | POA: Diagnosis not present

## 2020-06-11 DIAGNOSIS — D485 Neoplasm of uncertain behavior of skin: Secondary | ICD-10-CM | POA: Diagnosis not present

## 2020-09-03 DIAGNOSIS — D6869 Other thrombophilia: Secondary | ICD-10-CM | POA: Diagnosis not present

## 2020-09-03 DIAGNOSIS — U071 COVID-19: Secondary | ICD-10-CM | POA: Diagnosis not present

## 2020-10-25 DIAGNOSIS — L820 Inflamed seborrheic keratosis: Secondary | ICD-10-CM | POA: Diagnosis not present

## 2020-10-25 DIAGNOSIS — D2261 Melanocytic nevi of right upper limb, including shoulder: Secondary | ICD-10-CM | POA: Diagnosis not present

## 2020-10-25 DIAGNOSIS — L986 Other infiltrative disorders of the skin and subcutaneous tissue: Secondary | ICD-10-CM | POA: Diagnosis not present

## 2020-10-25 DIAGNOSIS — Z85820 Personal history of malignant melanoma of skin: Secondary | ICD-10-CM | POA: Diagnosis not present

## 2020-10-25 DIAGNOSIS — L570 Actinic keratosis: Secondary | ICD-10-CM | POA: Diagnosis not present

## 2020-10-25 DIAGNOSIS — D485 Neoplasm of uncertain behavior of skin: Secondary | ICD-10-CM | POA: Diagnosis not present

## 2020-10-25 DIAGNOSIS — D2262 Melanocytic nevi of left upper limb, including shoulder: Secondary | ICD-10-CM | POA: Diagnosis not present

## 2020-10-25 DIAGNOSIS — L814 Other melanin hyperpigmentation: Secondary | ICD-10-CM | POA: Diagnosis not present

## 2020-10-25 DIAGNOSIS — D225 Melanocytic nevi of trunk: Secondary | ICD-10-CM | POA: Diagnosis not present

## 2020-10-25 DIAGNOSIS — L738 Other specified follicular disorders: Secondary | ICD-10-CM | POA: Diagnosis not present

## 2020-10-28 ENCOUNTER — Other Ambulatory Visit: Payer: Self-pay | Admitting: Internal Medicine

## 2020-10-28 DIAGNOSIS — Z1231 Encounter for screening mammogram for malignant neoplasm of breast: Secondary | ICD-10-CM

## 2020-10-28 DIAGNOSIS — E785 Hyperlipidemia, unspecified: Secondary | ICD-10-CM | POA: Diagnosis not present

## 2020-10-28 DIAGNOSIS — E039 Hypothyroidism, unspecified: Secondary | ICD-10-CM | POA: Diagnosis not present

## 2020-11-04 DIAGNOSIS — Z808 Family history of malignant neoplasm of other organs or systems: Secondary | ICD-10-CM | POA: Diagnosis not present

## 2020-11-04 DIAGNOSIS — Z0000 Encounter for general adult medical examination without abnormal findings: Secondary | ICD-10-CM | POA: Diagnosis not present

## 2020-11-04 DIAGNOSIS — Z1389 Encounter for screening for other disorder: Secondary | ICD-10-CM | POA: Diagnosis not present

## 2020-11-04 DIAGNOSIS — Z1331 Encounter for screening for depression: Secondary | ICD-10-CM | POA: Diagnosis not present

## 2020-11-04 DIAGNOSIS — E785 Hyperlipidemia, unspecified: Secondary | ICD-10-CM | POA: Diagnosis not present

## 2020-11-04 DIAGNOSIS — Z23 Encounter for immunization: Secondary | ICD-10-CM | POA: Diagnosis not present

## 2020-11-04 DIAGNOSIS — E669 Obesity, unspecified: Secondary | ICD-10-CM | POA: Diagnosis not present

## 2020-11-04 DIAGNOSIS — C434 Malignant melanoma of scalp and neck: Secondary | ICD-10-CM | POA: Diagnosis not present

## 2020-11-04 DIAGNOSIS — N951 Menopausal and female climacteric states: Secondary | ICD-10-CM | POA: Diagnosis not present

## 2020-11-04 DIAGNOSIS — E039 Hypothyroidism, unspecified: Secondary | ICD-10-CM | POA: Diagnosis not present

## 2020-11-04 DIAGNOSIS — G4700 Insomnia, unspecified: Secondary | ICD-10-CM | POA: Diagnosis not present

## 2020-11-04 DIAGNOSIS — J302 Other seasonal allergic rhinitis: Secondary | ICD-10-CM | POA: Diagnosis not present

## 2020-11-04 DIAGNOSIS — R82998 Other abnormal findings in urine: Secondary | ICD-10-CM | POA: Diagnosis not present

## 2020-11-08 ENCOUNTER — Other Ambulatory Visit: Payer: Self-pay

## 2020-11-08 ENCOUNTER — Ambulatory Visit
Admission: RE | Admit: 2020-11-08 | Discharge: 2020-11-08 | Disposition: A | Discharge status: Home / Self Care | Payer: Medicare HMO | Source: Ambulatory Visit | Attending: Internal Medicine | Admitting: Internal Medicine

## 2020-11-08 DIAGNOSIS — Z1231 Encounter for screening mammogram for malignant neoplasm of breast: Secondary | ICD-10-CM

## 2021-01-23 DIAGNOSIS — D2262 Melanocytic nevi of left upper limb, including shoulder: Secondary | ICD-10-CM | POA: Diagnosis not present

## 2021-01-23 DIAGNOSIS — Z85820 Personal history of malignant melanoma of skin: Secondary | ICD-10-CM | POA: Diagnosis not present

## 2021-01-23 DIAGNOSIS — D2261 Melanocytic nevi of right upper limb, including shoulder: Secondary | ICD-10-CM | POA: Diagnosis not present

## 2021-01-23 DIAGNOSIS — D1801 Hemangioma of skin and subcutaneous tissue: Secondary | ICD-10-CM | POA: Diagnosis not present

## 2021-01-23 DIAGNOSIS — D2271 Melanocytic nevi of right lower limb, including hip: Secondary | ICD-10-CM | POA: Diagnosis not present

## 2021-01-23 DIAGNOSIS — L570 Actinic keratosis: Secondary | ICD-10-CM | POA: Diagnosis not present

## 2021-01-23 DIAGNOSIS — L821 Other seborrheic keratosis: Secondary | ICD-10-CM | POA: Diagnosis not present

## 2021-01-23 DIAGNOSIS — D225 Melanocytic nevi of trunk: Secondary | ICD-10-CM | POA: Diagnosis not present

## 2021-09-25 ENCOUNTER — Other Ambulatory Visit: Payer: Self-pay | Admitting: Internal Medicine

## 2021-09-25 DIAGNOSIS — Z1231 Encounter for screening mammogram for malignant neoplasm of breast: Secondary | ICD-10-CM

## 2021-11-10 ENCOUNTER — Ambulatory Visit: Payer: Medicare HMO

## 2021-12-05 ENCOUNTER — Ambulatory Visit
Admission: RE | Admit: 2021-12-05 | Discharge: 2021-12-05 | Disposition: A | Discharge status: Home / Self Care | Payer: Medicare PPO | Source: Ambulatory Visit | Attending: Internal Medicine | Admitting: Internal Medicine

## 2021-12-05 DIAGNOSIS — Z1231 Encounter for screening mammogram for malignant neoplasm of breast: Secondary | ICD-10-CM

## 2021-12-10 ENCOUNTER — Other Ambulatory Visit: Payer: Self-pay | Admitting: Internal Medicine

## 2021-12-10 DIAGNOSIS — R928 Other abnormal and inconclusive findings on diagnostic imaging of breast: Secondary | ICD-10-CM

## 2021-12-24 ENCOUNTER — Ambulatory Visit
Admission: RE | Admit: 2021-12-24 | Discharge: 2021-12-24 | Disposition: A | Discharge status: Home / Self Care | Payer: Medicare PPO | Source: Ambulatory Visit | Attending: Internal Medicine | Admitting: Internal Medicine

## 2021-12-24 ENCOUNTER — Other Ambulatory Visit: Payer: Self-pay | Admitting: Internal Medicine

## 2021-12-24 DIAGNOSIS — R928 Other abnormal and inconclusive findings on diagnostic imaging of breast: Secondary | ICD-10-CM

## 2022-01-06 ENCOUNTER — Ambulatory Visit
Admission: RE | Admit: 2022-01-06 | Discharge: 2022-01-06 | Disposition: A | Discharge status: Home / Self Care | Payer: Medicare PPO | Source: Ambulatory Visit | Attending: Internal Medicine | Admitting: Internal Medicine

## 2022-01-06 DIAGNOSIS — R928 Other abnormal and inconclusive findings on diagnostic imaging of breast: Secondary | ICD-10-CM

## 2022-01-06 HISTORY — PX: BREAST BIOPSY: SHX20

## 2022-04-27 DIAGNOSIS — L918 Other hypertrophic disorders of the skin: Secondary | ICD-10-CM | POA: Diagnosis not present

## 2022-04-27 DIAGNOSIS — L570 Actinic keratosis: Secondary | ICD-10-CM | POA: Diagnosis not present

## 2022-04-27 DIAGNOSIS — L738 Other specified follicular disorders: Secondary | ICD-10-CM | POA: Diagnosis not present

## 2022-04-27 DIAGNOSIS — D1801 Hemangioma of skin and subcutaneous tissue: Secondary | ICD-10-CM | POA: Diagnosis not present

## 2022-04-27 DIAGNOSIS — L820 Inflamed seborrheic keratosis: Secondary | ICD-10-CM | POA: Diagnosis not present

## 2022-04-27 DIAGNOSIS — D225 Melanocytic nevi of trunk: Secondary | ICD-10-CM | POA: Diagnosis not present

## 2022-04-27 DIAGNOSIS — D2261 Melanocytic nevi of right upper limb, including shoulder: Secondary | ICD-10-CM | POA: Diagnosis not present

## 2022-04-27 DIAGNOSIS — D2262 Melanocytic nevi of left upper limb, including shoulder: Secondary | ICD-10-CM | POA: Diagnosis not present

## 2022-04-27 DIAGNOSIS — L821 Other seborrheic keratosis: Secondary | ICD-10-CM | POA: Diagnosis not present

## 2022-04-27 DIAGNOSIS — Z85820 Personal history of malignant melanoma of skin: Secondary | ICD-10-CM | POA: Diagnosis not present

## 2022-10-23 ENCOUNTER — Other Ambulatory Visit: Payer: Self-pay | Admitting: Internal Medicine

## 2022-10-23 DIAGNOSIS — Z0000 Encounter for general adult medical examination without abnormal findings: Secondary | ICD-10-CM

## 2022-10-26 DIAGNOSIS — L821 Other seborrheic keratosis: Secondary | ICD-10-CM | POA: Diagnosis not present

## 2022-10-26 DIAGNOSIS — D485 Neoplasm of uncertain behavior of skin: Secondary | ICD-10-CM | POA: Diagnosis not present

## 2022-10-26 DIAGNOSIS — L570 Actinic keratosis: Secondary | ICD-10-CM | POA: Diagnosis not present

## 2022-10-26 DIAGNOSIS — D2262 Melanocytic nevi of left upper limb, including shoulder: Secondary | ICD-10-CM | POA: Diagnosis not present

## 2022-10-26 DIAGNOSIS — D2261 Melanocytic nevi of right upper limb, including shoulder: Secondary | ICD-10-CM | POA: Diagnosis not present

## 2022-10-26 DIAGNOSIS — D225 Melanocytic nevi of trunk: Secondary | ICD-10-CM | POA: Diagnosis not present

## 2022-10-26 DIAGNOSIS — Z85820 Personal history of malignant melanoma of skin: Secondary | ICD-10-CM | POA: Diagnosis not present

## 2022-10-26 DIAGNOSIS — D1801 Hemangioma of skin and subcutaneous tissue: Secondary | ICD-10-CM | POA: Diagnosis not present

## 2022-11-25 DIAGNOSIS — R3121 Asymptomatic microscopic hematuria: Secondary | ICD-10-CM | POA: Diagnosis not present

## 2022-11-25 DIAGNOSIS — Z0000 Encounter for general adult medical examination without abnormal findings: Secondary | ICD-10-CM | POA: Diagnosis not present

## 2022-11-25 DIAGNOSIS — E039 Hypothyroidism, unspecified: Secondary | ICD-10-CM | POA: Diagnosis not present

## 2022-11-25 DIAGNOSIS — N390 Urinary tract infection, site not specified: Secondary | ICD-10-CM | POA: Diagnosis not present

## 2022-11-25 DIAGNOSIS — Z808 Family history of malignant neoplasm of other organs or systems: Secondary | ICD-10-CM | POA: Diagnosis not present

## 2022-11-25 DIAGNOSIS — G4700 Insomnia, unspecified: Secondary | ICD-10-CM | POA: Diagnosis not present

## 2022-11-25 DIAGNOSIS — E785 Hyperlipidemia, unspecified: Secondary | ICD-10-CM | POA: Diagnosis not present

## 2022-11-25 DIAGNOSIS — S62102D Fracture of unspecified carpal bone, left wrist, subsequent encounter for fracture with routine healing: Secondary | ICD-10-CM | POA: Diagnosis not present

## 2022-11-25 DIAGNOSIS — D239 Other benign neoplasm of skin, unspecified: Secondary | ICD-10-CM | POA: Diagnosis not present

## 2022-12-10 DIAGNOSIS — D225 Melanocytic nevi of trunk: Secondary | ICD-10-CM | POA: Diagnosis not present

## 2022-12-10 DIAGNOSIS — D485 Neoplasm of uncertain behavior of skin: Secondary | ICD-10-CM | POA: Diagnosis not present

## 2023-01-11 ENCOUNTER — Ambulatory Visit
Admission: RE | Admit: 2023-01-11 | Discharge: 2023-01-11 | Disposition: A | Discharge status: Home / Self Care | Payer: Medicare HMO | Source: Ambulatory Visit | Attending: Internal Medicine

## 2023-01-11 DIAGNOSIS — Z1231 Encounter for screening mammogram for malignant neoplasm of breast: Secondary | ICD-10-CM | POA: Diagnosis not present

## 2023-01-11 DIAGNOSIS — Z0000 Encounter for general adult medical examination without abnormal findings: Secondary | ICD-10-CM

## 2023-04-26 DIAGNOSIS — Z85820 Personal history of malignant melanoma of skin: Secondary | ICD-10-CM | POA: Diagnosis not present

## 2023-04-26 DIAGNOSIS — L918 Other hypertrophic disorders of the skin: Secondary | ICD-10-CM | POA: Diagnosis not present

## 2023-04-26 DIAGNOSIS — D1801 Hemangioma of skin and subcutaneous tissue: Secondary | ICD-10-CM | POA: Diagnosis not present

## 2023-04-26 DIAGNOSIS — D2261 Melanocytic nevi of right upper limb, including shoulder: Secondary | ICD-10-CM | POA: Diagnosis not present

## 2023-04-26 DIAGNOSIS — D22111 Melanocytic nevi of right upper eyelid, including canthus: Secondary | ICD-10-CM | POA: Diagnosis not present

## 2023-04-26 DIAGNOSIS — L821 Other seborrheic keratosis: Secondary | ICD-10-CM | POA: Diagnosis not present

## 2023-04-26 DIAGNOSIS — L720 Epidermal cyst: Secondary | ICD-10-CM | POA: Diagnosis not present

## 2023-04-26 DIAGNOSIS — D225 Melanocytic nevi of trunk: Secondary | ICD-10-CM | POA: Diagnosis not present

## 2023-05-10 DIAGNOSIS — N951 Menopausal and female climacteric states: Secondary | ICD-10-CM | POA: Diagnosis not present

## 2023-05-10 DIAGNOSIS — M8580 Other specified disorders of bone density and structure, unspecified site: Secondary | ICD-10-CM | POA: Diagnosis not present

## 2023-06-15 DIAGNOSIS — J302 Other seasonal allergic rhinitis: Secondary | ICD-10-CM | POA: Diagnosis not present

## 2023-06-15 DIAGNOSIS — J029 Acute pharyngitis, unspecified: Secondary | ICD-10-CM | POA: Diagnosis not present

## 2023-06-15 DIAGNOSIS — R0981 Nasal congestion: Secondary | ICD-10-CM | POA: Diagnosis not present

## 2023-06-15 DIAGNOSIS — G43909 Migraine, unspecified, not intractable, without status migrainosus: Secondary | ICD-10-CM | POA: Diagnosis not present

## 2023-06-15 DIAGNOSIS — R051 Acute cough: Secondary | ICD-10-CM | POA: Diagnosis not present

## 2023-06-15 DIAGNOSIS — Z1152 Encounter for screening for COVID-19: Secondary | ICD-10-CM | POA: Diagnosis not present

## 2023-06-15 DIAGNOSIS — J189 Pneumonia, unspecified organism: Secondary | ICD-10-CM | POA: Diagnosis not present

## 2023-08-23 DIAGNOSIS — Z6834 Body mass index (BMI) 34.0-34.9, adult: Secondary | ICD-10-CM | POA: Diagnosis not present

## 2023-08-23 DIAGNOSIS — N182 Chronic kidney disease, stage 2 (mild): Secondary | ICD-10-CM | POA: Diagnosis not present

## 2023-08-23 DIAGNOSIS — E039 Hypothyroidism, unspecified: Secondary | ICD-10-CM | POA: Diagnosis not present

## 2023-08-23 DIAGNOSIS — M8580 Other specified disorders of bone density and structure, unspecified site: Secondary | ICD-10-CM | POA: Diagnosis not present

## 2023-08-23 DIAGNOSIS — Z809 Family history of malignant neoplasm, unspecified: Secondary | ICD-10-CM | POA: Diagnosis not present

## 2023-08-23 DIAGNOSIS — Z8249 Family history of ischemic heart disease and other diseases of the circulatory system: Secondary | ICD-10-CM | POA: Diagnosis not present

## 2023-08-23 DIAGNOSIS — J302 Other seasonal allergic rhinitis: Secondary | ICD-10-CM | POA: Diagnosis not present

## 2023-08-23 DIAGNOSIS — Z791 Long term (current) use of non-steroidal anti-inflammatories (NSAID): Secondary | ICD-10-CM | POA: Diagnosis not present

## 2023-08-23 DIAGNOSIS — E669 Obesity, unspecified: Secondary | ICD-10-CM | POA: Diagnosis not present

## 2023-08-23 DIAGNOSIS — R030 Elevated blood-pressure reading, without diagnosis of hypertension: Secondary | ICD-10-CM | POA: Diagnosis not present

## 2023-08-23 DIAGNOSIS — E785 Hyperlipidemia, unspecified: Secondary | ICD-10-CM | POA: Diagnosis not present

## 2023-08-23 DIAGNOSIS — Z881 Allergy status to other antibiotic agents status: Secondary | ICD-10-CM | POA: Diagnosis not present

## 2023-11-01 DIAGNOSIS — L918 Other hypertrophic disorders of the skin: Secondary | ICD-10-CM | POA: Diagnosis not present

## 2023-11-01 DIAGNOSIS — D2262 Melanocytic nevi of left upper limb, including shoulder: Secondary | ICD-10-CM | POA: Diagnosis not present

## 2023-11-01 DIAGNOSIS — L820 Inflamed seborrheic keratosis: Secondary | ICD-10-CM | POA: Diagnosis not present

## 2023-11-01 DIAGNOSIS — D1801 Hemangioma of skin and subcutaneous tissue: Secondary | ICD-10-CM | POA: Diagnosis not present

## 2023-11-01 DIAGNOSIS — D2271 Melanocytic nevi of right lower limb, including hip: Secondary | ICD-10-CM | POA: Diagnosis not present

## 2023-11-01 DIAGNOSIS — L814 Other melanin hyperpigmentation: Secondary | ICD-10-CM | POA: Diagnosis not present

## 2023-11-01 DIAGNOSIS — Z85820 Personal history of malignant melanoma of skin: Secondary | ICD-10-CM | POA: Diagnosis not present

## 2023-11-01 DIAGNOSIS — L821 Other seborrheic keratosis: Secondary | ICD-10-CM | POA: Diagnosis not present

## 2023-11-26 DIAGNOSIS — E785 Hyperlipidemia, unspecified: Secondary | ICD-10-CM | POA: Diagnosis not present

## 2023-11-26 DIAGNOSIS — E039 Hypothyroidism, unspecified: Secondary | ICD-10-CM | POA: Diagnosis not present

## 2023-11-30 DIAGNOSIS — R82998 Other abnormal findings in urine: Secondary | ICD-10-CM | POA: Diagnosis not present

## 2023-12-09 ENCOUNTER — Other Ambulatory Visit: Payer: Self-pay | Admitting: Internal Medicine

## 2023-12-09 DIAGNOSIS — Z1231 Encounter for screening mammogram for malignant neoplasm of breast: Secondary | ICD-10-CM

## 2024-01-07 ENCOUNTER — Encounter

## 2024-01-07 DIAGNOSIS — Z1231 Encounter for screening mammogram for malignant neoplasm of breast: Secondary | ICD-10-CM

## 2024-01-12 ENCOUNTER — Ambulatory Visit

## 2024-02-01 ENCOUNTER — Ambulatory Visit
Admission: RE | Admit: 2024-02-01 | Discharge: 2024-02-01 | Disposition: A | Discharge status: Home / Self Care | Source: Ambulatory Visit | Attending: Internal Medicine | Admitting: Internal Medicine

## 2024-02-01 DIAGNOSIS — Z1231 Encounter for screening mammogram for malignant neoplasm of breast: Secondary | ICD-10-CM
